# Patient Record
Sex: Male | Born: 1975 | Race: Black or African American | Hispanic: No | Smoking: Current every day smoker
Health system: Southern US, Community
[De-identification: ages and names within clinical notes are randomized; demographics above are authoritative.]

## PROBLEM LIST (undated history)

## (undated) DIAGNOSIS — Z87442 Personal history of urinary calculi: Secondary | ICD-10-CM

## (undated) DIAGNOSIS — K8020 Calculus of gallbladder without cholecystitis without obstruction: Secondary | ICD-10-CM

## (undated) DIAGNOSIS — K219 Gastro-esophageal reflux disease without esophagitis: Secondary | ICD-10-CM

## (undated) DIAGNOSIS — C801 Malignant (primary) neoplasm, unspecified: Secondary | ICD-10-CM

## (undated) HISTORY — PX: JOINT REPLACEMENT: SHX530

## (undated) HISTORY — PX: CHOLECYSTECTOMY: SHX55

## (undated) HISTORY — PX: APPENDECTOMY: SHX54

---
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
## 1997-07-13 HISTORY — PX: HAND SURGERY: SHX662

## 2005-04-06 ENCOUNTER — Emergency Department (HOSPITAL_COMMUNITY): Admission: EM | Admit: 2005-04-06 | Discharge: 2005-04-07 | Payer: Self-pay | Admitting: Emergency Medicine

## 2007-10-31 ENCOUNTER — Emergency Department (HOSPITAL_COMMUNITY): Admission: EM | Admit: 2007-10-31 | Discharge: 2007-10-31 | Payer: Self-pay | Admitting: Emergency Medicine

## 2007-12-08 ENCOUNTER — Emergency Department (HOSPITAL_COMMUNITY): Admission: EM | Admit: 2007-12-08 | Discharge: 2007-12-08 | Payer: Self-pay | Admitting: Emergency Medicine

## 2008-03-06 ENCOUNTER — Emergency Department (HOSPITAL_COMMUNITY): Admission: EM | Admit: 2008-03-06 | Discharge: 2008-03-06 | Payer: Self-pay | Admitting: Emergency Medicine

## 2008-03-13 ENCOUNTER — Encounter (INDEPENDENT_AMBULATORY_CARE_PROVIDER_SITE_OTHER): Payer: Self-pay | Admitting: General Surgery

## 2008-03-13 ENCOUNTER — Ambulatory Visit (HOSPITAL_COMMUNITY): Admission: RE | Admit: 2008-03-13 | Discharge: 2008-03-14 | Payer: Self-pay | Admitting: General Surgery

## 2009-06-01 ENCOUNTER — Emergency Department (HOSPITAL_COMMUNITY): Admission: EM | Admit: 2009-06-01 | Discharge: 2009-06-01 | Payer: Self-pay | Admitting: Emergency Medicine

## 2010-07-13 DIAGNOSIS — C801 Malignant (primary) neoplasm, unspecified: Secondary | ICD-10-CM

## 2010-07-13 HISTORY — DX: Malignant (primary) neoplasm, unspecified: C80.1

## 2010-10-15 LAB — URINE MICROSCOPIC-ADD ON

## 2010-10-15 LAB — URINALYSIS, ROUTINE W REFLEX MICROSCOPIC
Bilirubin Urine: NEGATIVE
Glucose, UA: NEGATIVE mg/dL
Ketones, ur: NEGATIVE mg/dL
Leukocytes, UA: NEGATIVE
Nitrite: NEGATIVE
Protein, ur: NEGATIVE mg/dL
Specific Gravity, Urine: 1.026 (ref 1.005–1.030)
Urobilinogen, UA: 0.2 mg/dL (ref 0.0–1.0)
pH: 5.5 (ref 5.0–8.0)

## 2010-10-15 LAB — BASIC METABOLIC PANEL
BUN: 14 mg/dL (ref 6–23)
CO2: 26 mEq/L (ref 19–32)
Calcium: 9.5 mg/dL (ref 8.4–10.5)
Creatinine, Ser: 1.08 mg/dL (ref 0.4–1.5)
GFR calc Af Amer: >60 mL/min (ref >60)
GFR calc non Af Amer: >60 mL/min (ref >60)
Glucose, Bld: 84 mg/dL (ref 70–99)
Potassium: 3.8 mEq/L (ref 3.5–5.1)
Sodium: 138 mEq/L (ref 135–145)

## 2010-10-15 LAB — DIFFERENTIAL
Basophils Absolute: 0.1 10*3/uL (ref 0.0–0.1)
Basophils Relative: 1 % (ref 0–1)
Eosinophils Absolute: 0.1 10*3/uL (ref 0.0–0.7)
Eosinophils Relative: 2 % (ref 0–5)
Lymphocytes Relative: 45 % (ref 12–46)
Lymphs Abs: 3.2 10*3/uL (ref 0.7–4.0)
Monocytes Absolute: 0.5 10*3/uL (ref 0.1–1.0)
Monocytes Relative: 7 % (ref 3–12)
Neutro Abs: 3.3 10*3/uL (ref 1.7–7.7)
Neutrophils Relative %: 46 % (ref 43–77)

## 2010-10-15 LAB — CBC
HCT: 41.4 % (ref 39.0–52.0)
Hemoglobin: 14 g/dL (ref 13.0–17.0)
MCHC: 33.8 g/dL (ref 30.0–36.0)
MCV: 85.8 fL (ref 78.0–100.0)
Platelets: 221 10*3/uL (ref 150–400)

## 2010-11-25 NOTE — Consult Note (Signed)
Kerry Wiley, Kerry Wiley NO.:  [id number]   MEDICAL RECORD NO.:  [id number]          PATIENT TYPE:  EMS   LOCATION:  ED                           FACILITY:  [facility]   PHYSICIAN:  Angelia Mould. Derrell Lolling, M.D.DATE OF BIRTH:  Feb 08, 1976   DATE OF CONSULTATION:  12/08/2007  DATE OF DISCHARGE:                                 CONSULTATION   EMERGENCY DEPARTMENT GENERAL SURGICAL CONSULTATION   REASON FOR CONSULTATION:  Evaluate symptomatic gallstones.   HISTORY OF PRESENT ILLNESS:  This is a 35 year old black man who states  that he has a 1-week history of intermittent episodes of epigastric  pain.  This will come and go.  He has been nauseated but has not  vomited.  The pain does not radiate to his back, it does not radiate to  his shoulder.  Denies fever or chills.  His bowel movements have been a  little softer this week.  No melena or blood in his stools.  He was  feeling well yesterday then last night after supper the pain got worse  and he came to the emergency room in the middle of night.  He had an  ultrasound this morning which shows gallstones but no evidence of any  inflammation, wall thickening or fluid.  Common bile duct measures 7.6  mm.  There is a complex left upper pole cystic lesion of his left  kidney.  This was noted in 2006 but is somewhat larger.  Dr. Preston Fleeting in  the emergency room has arranged for him to get a followup CT.  At this  time he is asymptomatic.  He has no nausea and all his pain has  resolved.  I was asked to evaluate him.   PAST HISTORY:  Cystic mass left kidney 2006 noted on CT scan.  He was  advised to see a urologist at that time but declined to do that.  He  denies any medical problems .  Specifically denies hypertension,  diabetes, asthma or cancer.  He had an appendectomy.  He had a right  total knee replacement.  He had right ankle surgery.  He had right hand  surgery.   CURRENT MEDICATIONS:  None.   DRUG ALLERGIES:  None known.   SOCIAL HISTORY:  The patient is engaged to be married.  He has one  child.  He works for [employer] in [employer].  He smokes one pack of  cigarettes per day.  Drinks alcohol on the weekends.   FAMILY HISTORY:  Father living, has hypertension and chronic renal  insufficiency.  Mother living and well.   REVIEW OF SYSTEMS:  A 15-system review of systems is performed and is  noncontributory except as described above.   PHYSICAL EXAMINATION:  GENERAL:  A pleasant young black man, somewhat  overweight in no distress.  His fiance and is father are with him  throughout the encounter.  VITAL SIGNS:  Temperature 97.5, blood pressure 156/93, pulse 64,  respirations 24, oxygen saturation 100% on room air.  HEENT:  EYES:  Sclerae clear.  Extraocular is intact.  No exophthalmos.  Ears,  nose, mouth and throat:  Nose, lips, tongue and oropharynx are  without gross lesions.  NECK:  Supple, nontender.  No adenopathy.  No mass.  No jugular  distention.  LUNGS:  Clear to auscultation.  No chest wall tenderness.  No chest wall  mass.  CARDIAC:  Heart is regular rate and rhythm, no murmur.  Radial, femoral  and posterior pulses are palpable.  No peripheral edema.  ABDOMEN:  Soft and nontender.  Liver and spleen not enlarged.  No mass.  No hernia.  EXTREMITIES:  Moves all four extremities well without pain or deformity.  He does have a vertically oriented incision on his right knee  anteriorly.  There is no peripheral edema.  NEUROLOGIC:  No gross motor or sensory deficits.   DIAGNOSTIC DATA:  Data are reviewed.  He has had a CBC, complete  metabolic panel, lipase and urinalysis which are all essentially normal.  He had an ultrasound as described above.   ASSESSMENT:  1. Chronic cholecystitis with cholelithiasis, now with recurring      attacks of biliary colic.  No evidence of acute inflammation at      this time.  2. Left renal cystic mass.  3. Status post right total knee replacement.  4. Tobacco  abuse.   PLAN:  1. The patient will be discharged home.  He has been given my business      card and has been asked to call my office to set up an appointment      to see me for preoperative consultation for cholecystectomy.  He      states that he wants to have the cholecystectomy.  2. I have advised him to see a urologist about his left renal mass,      and he states that he will do that.  3. I have discussed the indications and details of cholecystectomy      with him.  Risks and complications have been outlined, including      not limited to bleeding, infection, conversion to open laparotomy,      bile leak, injury to adjacent organs such as the intestine or bile      duct with major reconstructive surgery, wound problems such as      infection or hernia, cardiac and pulmonary problems, and other      unforeseen problems.  He seems to understand these issues well.  At      this time all his questions are answered.  He is in full agreement      with the plan.  He is willing to go home and follow up with me in      the office in a few days.      Angelia Mould. Derrell Lolling, M.D.  Electronically Signed     HMI/MEDQ  D:  12/08/2007  T:  12/08/2007  Job:  [id number]   cc:   Dione Booze, MD  [address] [place], [place] [address]

## 2010-11-25 NOTE — Op Note (Signed)
NAMETRAQUAN, DUARTE               ACCOUNT NO.:  [id number]   MEDICAL RECORD NO.:  [id number]          PATIENT TYPE:  OIB   LOCATION:  1518                         FACILITY:  [facility]   PHYSICIAN:  Angelia Mould. Derrell Lolling, M.D.DATE OF BIRTH:  [birth year]   DATE OF PROCEDURE:  03/13/2008  DATE OF DISCHARGE:                               OPERATIVE REPORT   PREOPERATIVE DIAGNOSIS:  Chronic cholecystitis with cholelithiasis.   POSTOPERATIVE DIAGNOSIS:  Chronic cholecystitis with cholelithiasis.   OPERATION PERFORMED:  Laparoscopic cholecystectomy with intraoperative  cholangiogram.   SURGEON:  Dr. Claud Kelp   ASSISTANT:  Dr. Consuello Bossier   OPERATIVE INDICATIONS:  This is a 35 year old black male who has had  intermittent episodes of epigastric and right upper quadrant pain and  nausea since May of this year.  This is typically brought on by meals.  He had an ultrasound which showed gallstones but was otherwise  unremarkable.  He had a CT scan which showed complex cyst of the left  upper pole of the kidney which has been present since 2006 and he is  being followed by Courtney Paris, M.D.  He has been counseled as  an outpatient regarding cholecystectomy.  He desires to have that done.  He is brought to operating room electively.   OPERATIVE FINDINGS:  The gallbladder was chronically inflamed.  There  was discolored and slightly thick-walled but there were only a few  adhesions to it.  The anatomy of the cystic duct, cystic artery and  common bile duct were conventional.  Intraoperative cholangiogram was  normal showing normal intrahepatic and extrahepatic bile ducts, no  filling defects, and no obstruction with good flow of contrast into the  duodenum.  The liver, stomach, duodenum, large intestine and small  intestine were normal to inspection.  There were some adhesions in the  right lower quadrant from previous appendectomy.   OPERATIVE TECHNIQUE:  Following induction of  general endotracheal  anesthesia the patient's abdomen was prepped and draped in sterile  fashion.  The patient was identified as to correct patient and correct  procedure.  Intravenous antibiotics were given.  Half percent Marcaine  with epinephrine was used as local infiltration anesthetic.  An 11 mm  Hassan trocar was inserted just above the umbilicus and secured the  pursestring suture of 0 Vicryl.  Pneumoperitoneum was created, video  camera was inserted with visualization and findings as described above.  A 10-mm trocars placed the subxiphoid region and two 5 mm trocars placed  the right upper quadrant.  The gallbladder was identified, the fundus  was elevated.  A few adhesions were taken down.  I dissected a large  lymph node of the triangle of Calot.  Divided the peritoneum and  isolated the cystic duct and cystic artery.  Cystic artery was isolated  as it went onto the wall of the gallbladder, was secured with multiple  metal clips and divided.  We then created a very large window behind the  cystic duct.  A cholangiogram catheter was inserted into the cystic  duct.  A cholangiogram was obtained using the C-arm  and the  cholangiogram was normal as described above.  The cholangiogram catheter  was removed, the cystic duct secured with multiple metal clips and  divided.  Gallbladder was dissected with electrocautery, placed in a  specimen bag and removed.  The operative field was inspected and  irrigated.  It was completely dry.  There was no bleeding or bile leak  whatsoever at the completion of the case.  All the irrigation fluid was  removed.  The trocars were removed under direct vision.  There was no  bleeding from trocar sites.  Pneumoperitoneum was released.  The fascia  at the umbilicus was closed with 0 Vicryl sutures.  Skin  incisions were closed with subcuticular sutures of 4-0 Monocryl and  Dermabond.  Clean bandages were placed and the patient taken recovery  room in  stable condition.  Estimated blood loss was about 10 mL.  Complications none.  Sponge, needle and instrument counts were correct.      Angelia Mould. Derrell Lolling, M.D.  Electronically Signed     HMI/MEDQ  D:  03/13/2008  T:  03/14/2008  Job:  [id number]

## 2011-04-08 LAB — CBC
MCV: 86.7
Platelets: 201
RBC: 4.47
WBC: 6.1

## 2011-04-08 LAB — URINALYSIS, ROUTINE W REFLEX MICROSCOPIC
Glucose, UA: NEGATIVE
Nitrite: NEGATIVE

## 2011-04-08 LAB — COMPREHENSIVE METABOLIC PANEL
Alkaline Phosphatase: 51
Calcium: 9.3
Creatinine, Ser: 1.11
GFR calc Af Amer: >60
GFR calc non Af Amer: >60
Glucose, Bld: 98
Total Bilirubin: 0.7
Total Protein: 6.1

## 2011-04-08 LAB — DIFFERENTIAL
Basophils Relative: 0
Lymphs Abs: 2.6
Monocytes Absolute: 0.4
Monocytes Relative: 6
Neutro Abs: 2.9
Neutrophils Relative %: 48

## 2011-04-08 LAB — LIPASE, BLOOD: Lipase: 25

## 2011-05-29 ENCOUNTER — Emergency Department (HOSPITAL_COMMUNITY): Payer: Self-pay

## 2011-05-29 ENCOUNTER — Encounter: Payer: Self-pay | Admitting: Emergency Medicine

## 2011-05-29 ENCOUNTER — Emergency Department (HOSPITAL_COMMUNITY)
Admission: EM | Admit: 2011-05-29 | Discharge: 2011-05-29 | Disposition: A | Discharge status: Home / Self Care | Payer: Self-pay | Attending: Emergency Medicine | Admitting: Emergency Medicine

## 2011-05-29 DIAGNOSIS — W208XXA Other cause of strike by thrown, projected or falling object, initial encounter: Secondary | ICD-10-CM | POA: Insufficient documentation

## 2011-05-29 DIAGNOSIS — R0789 Other chest pain: Secondary | ICD-10-CM | POA: Insufficient documentation

## 2011-05-29 DIAGNOSIS — Y990 Civilian activity done for income or pay: Secondary | ICD-10-CM | POA: Insufficient documentation

## 2011-05-29 DIAGNOSIS — R51 Headache: Secondary | ICD-10-CM | POA: Insufficient documentation

## 2011-05-29 DIAGNOSIS — Y9289 Other specified places as the place of occurrence of the external cause: Secondary | ICD-10-CM | POA: Insufficient documentation

## 2011-05-29 DIAGNOSIS — T07XXXA Unspecified multiple injuries, initial encounter: Secondary | ICD-10-CM | POA: Insufficient documentation

## 2011-05-29 DIAGNOSIS — M545 Low back pain, unspecified: Secondary | ICD-10-CM | POA: Insufficient documentation

## 2011-05-29 DIAGNOSIS — M542 Cervicalgia: Secondary | ICD-10-CM | POA: Insufficient documentation

## 2011-05-29 DIAGNOSIS — R109 Unspecified abdominal pain: Secondary | ICD-10-CM | POA: Insufficient documentation

## 2011-05-29 DIAGNOSIS — M25559 Pain in unspecified hip: Secondary | ICD-10-CM | POA: Insufficient documentation

## 2011-05-29 LAB — CBC
HCT: 41.5 % (ref 39.0–52.0)
MCH: 28.2 pg (ref 26.0–34.0)
RBC: 5.07 MIL/uL (ref 4.22–5.81)
RDW: 14.8 % (ref 11.5–15.5)
WBC: 8.4 10*3/uL (ref 4.0–10.5)

## 2011-05-29 LAB — DIFFERENTIAL
Eosinophils Absolute: 0.1 10*3/uL (ref 0.0–0.7)
Eosinophils Relative: 2 % (ref 0–5)
Lymphs Abs: 3.8 10*3/uL (ref 0.7–4.0)
Monocytes Absolute: 0.8 10*3/uL (ref 0.1–1.0)
Monocytes Relative: 9 % (ref 3–12)
Neutro Abs: 3.7 10*3/uL (ref 1.7–7.7)

## 2011-05-29 LAB — COMPREHENSIVE METABOLIC PANEL
AST: 22 U/L (ref 0–37)
Albumin: 4 g/dL (ref 3.5–5.2)
Alkaline Phosphatase: 61 U/L (ref 39–117)
BUN: 16 mg/dL (ref 6–23)
Calcium: 9.7 mg/dL (ref 8.4–10.5)

## 2011-05-29 LAB — URINALYSIS, ROUTINE W REFLEX MICROSCOPIC
Bilirubin Urine: NEGATIVE
Glucose, UA: NEGATIVE mg/dL
Leukocytes, UA: NEGATIVE
Protein, ur: NEGATIVE mg/dL
Urobilinogen, UA: 0.2 mg/dL (ref 0.0–1.0)

## 2011-05-29 MED ORDER — HYDROMORPHONE HCL PF 2 MG/ML IJ SOLN
2.0000 mg | Freq: Once | INTRAMUSCULAR | Status: AC
  Administered 2011-05-29: 2 mg via INTRAVENOUS
  Filled 2011-05-29: qty 1

## 2011-05-29 MED ORDER — CYCLOBENZAPRINE HCL 10 MG PO TABS: 10.0000 mg | ORAL_TABLET | Freq: Three times a day (TID) | ORAL | Status: AC | PRN

## 2011-05-29 MED ORDER — ONDANSETRON HCL 4 MG/2ML IJ SOLN
4.0000 mg | Freq: Once | INTRAMUSCULAR | Status: AC
  Administered 2011-05-29: 4 mg via INTRAMUSCULAR
  Filled 2011-05-29: qty 2

## 2011-05-29 MED ORDER — SODIUM CHLORIDE 0.9 % IV SOLN
INTRAVENOUS | Status: DC
  Administered 2011-05-29: 19:00:00 via INTRAVENOUS

## 2011-05-29 MED ORDER — ONDANSETRON HCL 4 MG/2ML IJ SOLN
4.0000 mg | Freq: Once | INTRAMUSCULAR | Status: AC
  Administered 2011-05-29: 4 mg via INTRAVENOUS
  Filled 2011-05-29: qty 2

## 2011-05-29 MED ORDER — IOHEXOL 300 MG/ML  SOLN
100.0000 mL | Freq: Once | INTRAMUSCULAR | Status: AC | PRN
  Administered 2011-05-29: 100 mL via INTRAVENOUS

## 2011-05-29 MED ORDER — OXYCODONE-ACETAMINOPHEN 5-325 MG PO TABS: 1.0000 | ORAL_TABLET | ORAL | Status: DC | PRN

## 2011-05-29 NOTE — ED Provider Notes (Signed)
History     CSN: [id number] Arrival date & time: 05/29/2011  3:18 PM   First MD Initiated Contact with Patient 05/29/11 1553      Chief Complaint  Patient presents with  . Work Related Injury    (Consider location/radiation/quality/duration/timing/severity/associated sxs/prior treatment) HPI Comments: Patient is a 35 year old man who says a wall 12' x 25 he fell on him at work yesterday. He did the right side of his body and he was smashed to the floor. He was not unconscious there is no bleeding he has pain in the right side of his head his right neck right chest the right side of his abdomen and lower back, in his right hip. He did not seek medical evaluation. He tried to take ibuprofen at home without relief. He did not go work today. He came to the hospital for evaluation because his pain was unremitting.   History reviewed. No pertinent past medical history.  Past Surgical History  Procedure Date  . Appendectomy   . Cholecystectomy   . Joint replacement     History reviewed. No pertinent family history.  History  Substance Use Topics  . Smoking status: Current Everyday Smoker  . Smokeless tobacco: Not on file  . Alcohol Use: Yes     social drinkier      Review of Systems  Constitutional: Negative.   HENT: Positive for neck pain.   Eyes: Negative.   Respiratory: Positive for chest tightness.   Cardiovascular:       He has right lateral chest wall pain.  Gastrointestinal: Negative.   Genitourinary: Negative.  Negative for hematuria.  Musculoskeletal:       He has pain in the right hip area.  Neurological: Negative.   Psychiatric/Behavioral: Negative.     Allergies  Review of patient's allergies indicates no known allergies.  Home Medications   Current Outpatient Rx  Name Route Sig Dispense Refill  . CYCLOBENZAPRINE HCL 10 MG PO TABS Oral Take 1 tablet (10 mg total) by mouth 3 (three) times daily as needed for muscle spasms. 15 tablet 0  .  OXYCODONE-ACETAMINOPHEN 5-325 MG PO TABS Oral Take 1 tablet by mouth every 4 (four) hours as needed for pain. 20 tablet 0    BP 146/83  Pulse 88  Temp(Src) 97.2 F (36.2 C) (Oral)  Resp 20  SpO2 95%  Physical Exam  Nursing note and vitals reviewed. Constitutional: He is oriented to person, place, and time. He appears well-developed and well-nourished. He appears distressed (patient is in moderate to severe distress with pain in the right side of his body.).       Patient is a young man in moderate to severe distress with pain in the right side of his body.  HENT:  Head: Normocephalic and atraumatic.  Right Ear: External ear normal.  Left Ear: External ear normal.  Mouth/Throat: Oropharynx is clear and moist.  Eyes: Conjunctivae and EOM are normal. Pupils are equal, round, and reactive to light.  Neck: Normal range of motion. Neck supple.       He localizes pain to the right posterior cervical paraspinous muscles. There is no palpable bony deformity of the neck  Cardiovascular: Normal rate and regular rhythm.   Pulmonary/Chest: Effort normal and breath sounds normal.       He localizes pain to the right lateral chest wall. There is no palpable deformity, crepitance, or subcutaneous emphysema.  Abdominal: Soft. Bowel sounds are normal.  Musculoskeletal:  He localizes pain to the lumbar region into the hip. There is some tenderness to palpation over the right greater trochanter. There is no palpable bony deformity of the right hip ORIF lumbar spine.  Neurological: He is alert and oriented to person, place, and time.       There no sensory or motor deficits.  Skin: Skin is warm and dry.  Psychiatric: He has a normal mood and affect. His behavior is normal.    ED Course  Procedures (including critical care time)  Labs Reviewed  URINALYSIS, ROUTINE W REFLEX MICROSCOPIC - Abnormal; Notable for the following:    Specific Gravity, Urine 1.031 (*)    Ketones, ur 40 (*)    All  other components within normal limits  CBC  DIFFERENTIAL  COMPREHENSIVE METABOLIC PANEL  ETHANOL   Pt was seen and had physical Exam.  Lab and x-ray workup were all negative. Rx for Percocet and Flexeril.  No work for 3 days.  1. Contusion of multiple sites           Carleene Cooper III, MD 05/29/11 [phone]

## 2011-05-29 NOTE — ED Notes (Signed)
Assumed care of patient, report from Christa RN. Awaiting disposition, MD now at bedside to speak with patient, will continue to monitor.

## 2011-05-29 NOTE — ED Notes (Signed)
Pt c/o pain since yesterday when at work and "a wall fell on me".  States his back, right arm/leg, and right abdomen are hurting.  Took ibuprofen PTA with no relief.  Right side of abdomen is firm and tender to palpation.

## 2011-05-29 NOTE — ED Notes (Signed)
Pt reports a 12 ft petition wall fell on him yesterday at work.  C/o pain all over.  States R side is hurting worse than left.

## 2011-05-29 NOTE — Discharge Instructions (Signed)
Contusion A bruise (contusion) or hematoma is a collection of blood under skin causing an area of discoloration. It is caused by an injury to blood vessels beneath the injured area with a release of blood into that area. As blood accumulates it is known as a hematoma. This collection of blood causes a blue to dark blue color. As the injury improves over days to weeks it turns to a yellowish color and then usually disappears completely over the same period of time. These generally resolve completely without problems. The hematoma rarely requires drainage. HOME CARE INSTRUCTIONS   Apply ice to the injured area for 15 to 20 minutes 3 to 4 times per day for the first 1 or 2 days.   Put the ice in a plastic bag and place a towel between the bag of ice and your skin. Discontinue the ice if it causes pain.   If bleeding is more than just a little, apply pressure to the area for at least thirty minutes to decrease the amount of bruising. Apply pressure and ice as your caregiver suggests.   If the injury is on an extremity, elevation of that part may help to decrease pain and swelling. Wrapping with an ace or supportive wrap may also be helpful. If the bruise is on a lower extremity and is painful, crutches may be helpful for a couple days.   If you have been given a tetanus shot because the skin was broken, your arm may get swollen, red and warm to touch at the shot site. This is a normal response to the medicine in the shot. If you did not receive a tetanus shot today because you did not recall when your last one was given, make sure to check with your caregiver's office and determine if one is needed. Generally for a "dirty" wound, you should receive a tetanus booster if you have not had one in the last five years. If you have a "clean" wound, you should receive a tetanus booster if you have not had one within the last ten years.  SEEK MEDICAL CARE IF:   You have pain not controlled with over the counter  medications. Only take over-the-counter or prescription medicines for pain, discomfort, or fever as directed by your caregiver. Do not use aspirin as it may cause bleeding.   You develop increasing pain or swelling in the area of injury.   You develop any problems which seem worse than the problems which brought you in.  SEEK IMMEDIATE MEDICAL CARE IF:   You have a fever.   You develop severe pain in the area of the bruise out of proportion to the initial injury.   The bruised area becomes red, tender, and swollen.  MAKE SURE YOU:   Understand these instructions.   Will watch your condition.   Will get help right away if you are not doing well or get worse.  Document Released: 04/08/2005 Document Revised: 03/11/2011 Document Reviewed: 02/15/2008 [facility] Patient Information 2012 [place], [place].  Kerry Wiley, you had physical exam, laboratory testing, and x-rays of your head, neck, chest, abdomen, and right hip, to check on you after a wall fell on you yesterday while at work. Fortunately your laboratory tests and x-rays showed no internal injury and no fracture. It is safe for you to go home. You can take the pain pills Percocet every 4 hours as needed for pain. You can take Flexeril, the muscle relaxant, 3 times a day. No work for 3 days.

## 2011-05-29 NOTE — ED Notes (Signed)
Patient being discharged. IV d/c catheter intact, dressing applied. D/C Instructions explained to patient, verbalizes understanding, no further questions. Patient provided with prescriptions for Percocet and Flexeril with instructions on dosage and administration. Work note provided as well. Patient escorted to discharge window with all personal belongings.

## 2012-01-10 ENCOUNTER — Emergency Department (HOSPITAL_BASED_OUTPATIENT_CLINIC_OR_DEPARTMENT_OTHER)
Admission: EM | Admit: 2012-01-10 | Discharge: 2012-01-10 | Disposition: A | Discharge status: Home Health | Payer: Self-pay | Attending: Emergency Medicine | Admitting: Emergency Medicine

## 2012-01-10 ENCOUNTER — Encounter (HOSPITAL_BASED_OUTPATIENT_CLINIC_OR_DEPARTMENT_OTHER): Payer: Self-pay | Admitting: *Deleted

## 2012-01-10 DIAGNOSIS — S60351A Superficial foreign body of right thumb, initial encounter: Secondary | ICD-10-CM

## 2012-01-10 DIAGNOSIS — F17200 Nicotine dependence, unspecified, uncomplicated: Secondary | ICD-10-CM | POA: Insufficient documentation

## 2012-01-10 DIAGNOSIS — X58XXXA Exposure to other specified factors, initial encounter: Secondary | ICD-10-CM | POA: Insufficient documentation

## 2012-01-10 DIAGNOSIS — H579 Unspecified disorder of eye and adnexa: Secondary | ICD-10-CM

## 2012-01-10 DIAGNOSIS — S60459A Superficial foreign body of unspecified finger, initial encounter: Secondary | ICD-10-CM | POA: Insufficient documentation

## 2012-01-10 MED ORDER — TETRACAINE HCL 0.5 % OP SOLN
OPHTHALMIC | Status: AC
  Administered 2012-01-10: 17:00:00
  Filled 2012-01-10: qty 2

## 2012-01-10 MED ORDER — LIDOCAINE HCL 2 % IJ SOLN: 20.0000 mL | Freq: Once | INTRAMUSCULAR | Status: DC

## 2012-01-10 MED ORDER — LIDOCAINE HCL 2 % IJ SOLN
INTRAMUSCULAR | Status: AC
  Filled 2012-01-10: qty 1

## 2012-01-10 MED ORDER — FLUORESCEIN SODIUM 1 MG OP STRP
ORAL_STRIP | OPHTHALMIC | Status: AC
  Administered 2012-01-10: 17:00:00
  Filled 2012-01-10: qty 1

## 2012-01-10 MED ORDER — TETANUS-DIPHTH-ACELL PERTUSSIS 5-2.5-18.5 LF-MCG/0.5 IM SUSP
INTRAMUSCULAR | Status: AC
  Administered 2012-01-10: 0.5 mL via INTRAMUSCULAR
  Filled 2012-01-10: qty 0.5

## 2012-01-10 MED ORDER — TETANUS-DIPHTH-ACELL PERTUSSIS 5-2.5-18.5 LF-MCG/0.5 IM SUSP
0.5000 mL | Freq: Once | INTRAMUSCULAR | Status: AC
  Administered 2012-01-10: 0.5 mL via INTRAMUSCULAR

## 2012-01-10 NOTE — Discharge Instructions (Signed)
Puncture Wound  A puncture wound is an injury that extends through all layers of the skin and into the tissue beneath the skin (subcutaneous tissue). Puncture wounds become infected easily because germs often enter the body and go beneath the skin during the injury. Having a deep wound with a small entrance point makes it difficult for your caregiver to adequately clean the wound. This is especially true if you have stepped on a nail and it has passed through a dirty shoe or other situations where the wound is obviously contaminated.  CAUSES   Many puncture wounds involve glass, nails, splinters, fish hooks, or other objects that enter the skin (foreign bodies). A puncture wound may also be caused by a human bite or animal bite.  DIAGNOSIS   A puncture wound is usually diagnosed by your history and a physical exam. You may need to have an X-Delano Frate or an ultrasound to check for any foreign bodies still in the wound.  TREATMENT    Your caregiver will clean the wound as thoroughly as possible. Depending on the location of the wound, a bandage (dressing) may be applied.   Your caregiver might prescribe antibiotic medicines.   You may need a follow-up visit to check on your wound. Follow all instructions as directed by your caregiver.  HOME CARE INSTRUCTIONS    Change your dressing once per day, or as directed by your caregiver. If the dressing sticks, it may be removed by soaking the area in water.   If your caregiver has given you follow-up instructions, it is very important that you return for a follow-up appointment. Not following up as directed could result in a chronic or permanent injury, pain, and disability.   Only take over-the-counter or prescription medicines for pain, discomfort, or fever as directed by your caregiver.   If you are given antibiotics, take them as directed. Finish them even if you start to feel better.  You may need a tetanus shot if:   You cannot remember when you had your last tetanus  shot.   You have never had a tetanus shot.  If you got a tetanus shot, your arm may swell, get red, and feel warm to the touch. This is common and not a problem. If you need a tetanus shot and you choose not to have one, there is a rare chance of getting tetanus. Sickness from tetanus can be serious.  You may need a rabies shot if an animal bite caused your puncture wound.  SEEK MEDICAL CARE IF:    You have redness, swelling, or increasing pain in the wound.   You have red streaks going away from the wound.   You notice a bad smell coming from the wound or dressing.   You have yellowish-white fluid (pus) coming from the wound.   You are treated with an antibiotic for infection, but the infection is not getting better.   You notice something in the wound, such as rubber from your shoe, cloth, or another object.   You have a fever.   You have severe pain.   You have difficulty breathing.   You feel dizzy or faint.   You cannot stop vomiting.   You lose feeling, develop numbness, or cannot move a limb below the wound.   Your symptoms worsen.  MAKE SURE YOU:   Understand these instructions.   Will watch your condition.   Will get help right away if you are not doing well or get worse.    [employer].

## 2012-01-10 NOTE — ED Notes (Signed)
Pt presents with fish hook in right thumb.

## 2012-01-10 NOTE — ED Provider Notes (Signed)
History  This chart was scribed for Kerry Quarry, MD by Bennett Scrape. This patient was seen in Room 2 and the patient's care was started at 4:52PM.  CSN: [id number]  Arrival date & time 01/10/12  1643   First MD Initiated Contact with Patient 01/10/12 1652      Chief Complaint  Patient presents with  . Foreign Body    Patient is a 36 y.o. male presenting with foreign body. The history is provided by the patient. No language interpreter was used.  Foreign Body  The current episode started less than 1 hour ago. Intake: in right thumb. Suspected object: fish hook. Pertinent negatives include no fever and no vomiting. There were no sick contacts. He has received no recent medical care.    Kerry Wiley is a 36 y.o. male who presents to the Emergency Department complaining of one fish hook in the right thumb that occurred approximately 20 minutes PTA. He states that he had live bait debris on the hoot at the time of the impalement. He reports that he tried to remove it on his own but was unable to due to pain. He denies taking OTC medications at home to improve symptoms.  He also c/o one month of an unknown foreign body in the right upper eyelid. He denies visual disturbance, fever, nausea and emesis as associated symptoms. He has a h/o renal disorder. TD vaccine is unknown. He is a current everyday smoker and occasional alcohol user.  No PCP.  Past Medical History  Diagnosis Date  . Renal disorder     Past Surgical History  Procedure Date  . Appendectomy   . Cholecystectomy   . Joint replacement   . Hand surgery   . Fracture surgery     History reviewed. No pertinent family history.  History  Substance Use Topics  . Smoking status: Current Everyday Smoker  . Smokeless tobacco: Not on file  . Alcohol Use: Yes     social drinkier      Review of Systems  Constitutional: Negative for fever.  Gastrointestinal: Negative for nausea and vomiting.  Skin: Positive for wound  (fish hook in right thumb).    Allergies  Review of patient's allergies indicates no known allergies.  Home Medications  No current outpatient prescriptions on file.  Triage Vitals: BP 156/86  Pulse 99  Temp 98.1 F (36.7 C) (Oral)  Resp 20  Ht 6\' 2"  (1.88 m)  Wt 245 lb (111.131 kg)  BMI 31.46 kg/m2  SpO2 96%  Physical Exam  Nursing note and vitals reviewed. Constitutional: He is oriented to person, place, and time. He appears well-developed and well-nourished. No distress.  HENT:  Head: Normocephalic and atraumatic.  Eyes: EOM are normal. Pupils are equal, round, and reactive to light.       Small area of irritated and swollen conjunctivae tissue where it meets the lid of the right eye  Neck: Neck supple. No tracheal deviation present.  Cardiovascular: Normal rate.   Pulmonary/Chest: Effort normal. No respiratory distress.  Musculoskeletal: Normal range of motion.       Fish hook in right thumb  Neurological: He is alert and oriented to person, place, and time.  Skin: Skin is warm and dry.  Psychiatric: He has a normal mood and affect. His behavior is normal.    ED Course  Procedures (including critical care time)  DIAGNOSTIC STUDIES: Oxygen Saturation is 96% on room air, adequate by my interpretation.    COORDINATION OF CARE:  4:55PM-Discussed foreign body removal with pt and pt agreed to plan.  FOREIGN BODY REMOVAL Performed by: Kerry Quarry, MD at 5:00PM Consent: Verbal consent obtained. Risks and benefits: risks, benefits and alternatives were discussed Patient identity confirmed: provided demographic data Time out performed prior to procedure Prepped and Draped in normal sterile fashion Wound explored  Foreign Body Location: Right thumb  Foreign Body Type: One Barbed Fish Hook  Anesthesia: local infiltration  Local anesthetic: lidocaine 1% without epinephrine  Anesthetic total: 1 ml  Foreign body was cut with pilars and then pushed through the  thumb  Irrigation method: irrigated with a copious amount of saline and vigorously wiped  Amount of cleaning: standard  Patient tolerance: Patient tolerated the procedure well with no immediate complications.  5:08PM-Pt's right eye was examined with the slit lamp. 5:13PM-Discussed discharge plan which includes a referral to an ophthalmologist  with pt and pt agreed to the plan.   Labs Reviewed - No data to display No results found.   No diagnosis found.    MDM  I personally performed the services described in this documentation, which was scribed in my presence. The recorded information has been reviewed and considered.        Kerry Quarry, MD 01/15/12 [phone]

## 2012-02-12 ENCOUNTER — Encounter (HOSPITAL_BASED_OUTPATIENT_CLINIC_OR_DEPARTMENT_OTHER): Payer: Self-pay

## 2012-02-12 ENCOUNTER — Emergency Department (HOSPITAL_BASED_OUTPATIENT_CLINIC_OR_DEPARTMENT_OTHER)
Admission: EM | Admit: 2012-02-12 | Discharge: 2012-02-12 | Disposition: A | Discharge status: Home / Self Care | Payer: Self-pay | Attending: Emergency Medicine | Admitting: Emergency Medicine

## 2012-02-12 ENCOUNTER — Emergency Department (HOSPITAL_BASED_OUTPATIENT_CLINIC_OR_DEPARTMENT_OTHER): Payer: Self-pay

## 2012-02-12 DIAGNOSIS — S62319A Displaced fracture of base of unspecified metacarpal bone, initial encounter for closed fracture: Secondary | ICD-10-CM | POA: Insufficient documentation

## 2012-02-12 DIAGNOSIS — S62309A Unspecified fracture of unspecified metacarpal bone, initial encounter for closed fracture: Secondary | ICD-10-CM

## 2012-02-12 DIAGNOSIS — W19XXXA Unspecified fall, initial encounter: Secondary | ICD-10-CM | POA: Insufficient documentation

## 2012-02-12 DIAGNOSIS — F17200 Nicotine dependence, unspecified, uncomplicated: Secondary | ICD-10-CM | POA: Insufficient documentation

## 2012-02-12 MED ORDER — HYDROCODONE-ACETAMINOPHEN 5-325 MG PO TABS
2.0000 | ORAL_TABLET | Freq: Once | ORAL | Status: AC
  Administered 2012-02-12: 2 via ORAL
  Filled 2012-02-12: qty 2

## 2012-02-12 MED ORDER — ONDANSETRON HCL 4 MG/2ML IJ SOLN
4.0000 mg | Freq: Once | INTRAMUSCULAR | Status: AC
  Administered 2012-02-12: 4 mg via INTRAMUSCULAR
  Filled 2012-02-12: qty 2

## 2012-02-12 MED ORDER — HYDROCODONE-ACETAMINOPHEN 5-325 MG PO TABS: 2.0000 | ORAL_TABLET | ORAL | Status: DC | PRN

## 2012-02-12 MED ORDER — HYDROMORPHONE HCL PF 1 MG/ML IJ SOLN
2.0000 mg | Freq: Once | INTRAMUSCULAR | Status: AC
  Administered 2012-02-12: 2 mg via INTRAMUSCULAR
  Filled 2012-02-12: qty 2

## 2012-02-12 NOTE — ED Provider Notes (Signed)
History     CSN: [id number]  Arrival date & time 02/12/12  1444   First MD Initiated Contact with Patient 02/12/12 1525      Chief Complaint  Patient presents with  . Hand Injury    (Consider location/radiation/quality/duration/timing/severity/associated sxs/prior treatment) Patient is a 36 y.o. male presenting with hand pain. The history is provided by the patient. No language interpreter was used.  Hand Pain This is a new problem. The current episode started today. The problem occurs constantly. The problem has been gradually worsening. Associated symptoms include joint swelling and myalgias. Nothing aggravates the symptoms. He has tried nothing for the symptoms. The treatment provided moderate relief.  Pt complains of pain in his right hand.  Pt reports he fell at work and hit right hand  Past Medical History  Diagnosis Date  . Renal disorder     Past Surgical History  Procedure Date  . Appendectomy   . Cholecystectomy   . Joint replacement   . Hand surgery   . Fracture surgery     No family history on file.  History  Substance Use Topics  . Smoking status: Current Everyday Smoker  . Smokeless tobacco: Not on file  . Alcohol Use: Yes     social drinkier      Review of Systems  Musculoskeletal: Positive for myalgias and joint swelling.  All other systems reviewed and are negative.    Allergies  Review of patient's allergies indicates no known allergies.  Home Medications  No current outpatient prescriptions on file.  BP 151/71  Pulse 86  Temp 98.2 F (36.8 C) (Oral)  Resp 16  Ht 6\' 2"  (1.88 m)  Wt 250 lb (113.399 kg)  BMI 32.10 kg/m2  SpO2 97%  Physical Exam  Nursing note and vitals reviewed. Constitutional: He appears well-developed and well-nourished.  HENT:  Head: Normocephalic and atraumatic.  Musculoskeletal: He exhibits tenderness.       Tender right hand,  From,  nv and ns intact,    Neurological: He is alert.  Skin: Skin is warm.    Psychiatric: He has a normal mood and affect.    ED Course  Procedures (including critical care time)  Labs Reviewed - No data to display Dg Forearm Right  02/12/2012  *RADIOLOGY REPORT*  Clinical Data: Fall with swelling.  RIGHT FOREARM - 2 VIEW  Comparison: Right hand 02/12/2012  Findings: Two views of the forearm demonstrate a mildly displaced fracture involving the proximal fifth metacarpal bone.  Fracture appears to extend to the articulating surface.  Plate and screw fixation of the fourth metacarpal bone.  The radius and ulna are intact.  IMPRESSION: Fracture involving the proximal fifth metacarpal bone.  Original Report Authenticated By: Richarda Overlie, M.D.   Dg Wrist Complete Right  02/12/2012  *RADIOLOGY REPORT*  Clinical Data: Larey Seat on right hand.  Pain at fifth metacarpal bone.  RIGHT WRIST - COMPLETE 3+ VIEW  Comparison: None.  Findings: Patient has plate and screw fixation of the fourth metacarpal bone. There is a mildly displaced fracture involving the proximal fifth metacarpal bone.  There is marked soft tissue swelling adjacent to the fracture. Fracture appears to involve the carpal metacarpal joint.  There is deformity of the distal fifth metacarpal bone and suspect an old fracture at this site.  IMPRESSION: Mildly displaced fracture involving the proximal fifth metacarpal bone.  Original Report Authenticated By: Richarda Overlie, M.D.   Dg Hand Complete Right  02/12/2012  *RADIOLOGY REPORT*  Clinical Data:  Pain at fifth metacarpal bone.  RIGHT HAND - COMPLETE 3+ VIEW  Comparison: Right wrist 02/12/2012  Findings: Fracture involving the proximal fifth metacarpal bone. Fracture probably extends to the articulating surface of the carpal metacarpal joint.  Plate and screw fixation of the fourth metacarpal bone.  Deformity of the distal fifth metacarpal bone suggests an old injury. Soft tissue swelling adjacent to the fifth metacarpal bone.  IMPRESSION: Displaced fracture involving the proximal fifth  metacarpal bone.  Original Report Authenticated By: Richarda Overlie, M.D.     No diagnosis found.    MDM  Splint,  Ice,   Hydrocodone         Lonia Skinner [place], [place] 02/12/12 [phone]

## 2012-02-12 NOTE — ED Provider Notes (Signed)
Medical screening examination/treatment/procedure(s) were performed by non-physician practitioner and as supervising physician I was immediately available for consultation/collaboration.   Gavin Pound. Jagger Demonte, MD 02/12/12 1607

## 2012-02-12 NOTE — ED Notes (Signed)
Injury to right hand after falling at work.

## 2012-02-12 NOTE — ED Notes (Signed)
Patient transported to X-ray 

## 2012-02-16 ENCOUNTER — Other Ambulatory Visit: Payer: Self-pay | Admitting: Orthopedic Surgery

## 2012-02-16 ENCOUNTER — Encounter (HOSPITAL_COMMUNITY)
Admission: RE | Admit: 2012-02-16 | Discharge: 2012-02-16 | Disposition: A | Discharge status: Home / Self Care | Payer: Self-pay | Source: Ambulatory Visit | Attending: Orthopedic Surgery | Admitting: Orthopedic Surgery

## 2012-02-16 ENCOUNTER — Encounter (HOSPITAL_COMMUNITY): Payer: Self-pay | Admitting: Pharmacy Technician

## 2012-02-16 ENCOUNTER — Encounter (HOSPITAL_COMMUNITY): Payer: Self-pay

## 2012-02-16 HISTORY — DX: Gastro-esophageal reflux disease without esophagitis: K21.9

## 2012-02-16 LAB — CBC
HCT: 42.3 % (ref 39.0–52.0)
Hemoglobin: 14.4 g/dL (ref 13.0–17.0)
MCH: 28.3 pg (ref 26.0–34.0)
MCHC: 34 g/dL (ref 30.0–36.0)
MCV: 83.3 fL (ref 78.0–100.0)
Platelets: 263 10*3/uL (ref 150–400)
RBC: 5.08 MIL/uL (ref 4.22–5.81)
RDW: 15.1 % (ref 11.5–15.5)
WBC: 8.7 10*3/uL (ref 4.0–10.5)

## 2012-02-16 LAB — SURGICAL PCR SCREEN
MRSA, PCR: NEGATIVE
Staphylococcus aureus: POSITIVE — AB

## 2012-02-16 MED ORDER — CEFAZOLIN SODIUM-DEXTROSE 2-3 GM-% IV SOLR
2.0000 g | INTRAVENOUS | Status: AC
  Administered 2012-02-17: 2 g via INTRAVENOUS

## 2012-02-16 MED ORDER — CHLORHEXIDINE GLUCONATE 4 % EX LIQD: 60.0000 mL | Freq: Once | CUTANEOUS | Status: DC

## 2012-02-16 NOTE — Progress Notes (Signed)
Pt notified of time change;to arrive at 12:45 pm;nothing to eat/drink after midnight

## 2012-02-16 NOTE — Pre-Procedure Instructions (Addendum)
20 Anan Stutsman  02/16/2012   Your procedure is scheduled on:  02/17/12  Report to [facility] at 2:15 PM.  Call this number if you have problems the morning of surgery: [phone]   Remember: Bring Incentive Spirometer back in suitcase day of surgery.   Do not eat food or any liquids :After Midnight.   Take these medicines the morning of surgery with A SIP OF WATER: NONE   Do not wear jewelry, make-up or nail polish.  Do not wear lotions, powders, or perfumes. You may wear deodorant.  Do not shave 48 hours prior to surgery. Men may shave face and neck.  Do not bring valuables to the hospital.  Contacts, dentures or bridgework may not be worn into surgery.  Leave suitcase in the car. After surgery it may be brought to your room.  For patients admitted to the hospital, checkout time is 11:00 AM the day of discharge.   Patients discharged the day of surgery will not be allowed to drive home.  Name and phone number of your driver: girlfriend  Special Instructions: CHG Shower Use Special Wash: 1/2 bottle night before surgery and 1/2 bottle morning of surgery.   Please read over the following fact sheets that you were given: Pain Booklet, Coughing and Deep Breathing, MRSA Information and Surgical Site Infection Prevention

## 2012-02-17 ENCOUNTER — Ambulatory Visit (HOSPITAL_COMMUNITY)
Admission: RE | Admit: 2012-02-17 | Discharge: 2012-02-17 | Disposition: A | Discharge status: Home / Self Care | Payer: Self-pay | Source: Ambulatory Visit | Attending: Orthopedic Surgery | Admitting: Orthopedic Surgery

## 2012-02-17 ENCOUNTER — Encounter (HOSPITAL_COMMUNITY): Payer: Self-pay | Admitting: Anesthesiology

## 2012-02-17 ENCOUNTER — Ambulatory Visit (HOSPITAL_COMMUNITY): Payer: Self-pay | Admitting: Anesthesiology

## 2012-02-17 ENCOUNTER — Encounter (HOSPITAL_COMMUNITY): Payer: Self-pay | Admitting: *Deleted

## 2012-02-17 ENCOUNTER — Encounter (HOSPITAL_COMMUNITY)
Admission: RE | Disposition: A | Discharge status: Home / Self Care | Payer: Self-pay | Source: Ambulatory Visit | Attending: Orthopedic Surgery

## 2012-02-17 DIAGNOSIS — Z01812 Encounter for preprocedural laboratory examination: Secondary | ICD-10-CM | POA: Insufficient documentation

## 2012-02-17 DIAGNOSIS — S62319A Displaced fracture of base of unspecified metacarpal bone, initial encounter for closed fracture: Secondary | ICD-10-CM | POA: Insufficient documentation

## 2012-02-17 DIAGNOSIS — S62309A Unspecified fracture of unspecified metacarpal bone, initial encounter for closed fracture: Secondary | ICD-10-CM | POA: Diagnosis present

## 2012-02-17 DIAGNOSIS — F17200 Nicotine dependence, unspecified, uncomplicated: Secondary | ICD-10-CM | POA: Insufficient documentation

## 2012-02-17 DIAGNOSIS — X58XXXA Exposure to other specified factors, initial encounter: Secondary | ICD-10-CM | POA: Insufficient documentation

## 2012-02-17 DIAGNOSIS — K219 Gastro-esophageal reflux disease without esophagitis: Secondary | ICD-10-CM | POA: Insufficient documentation

## 2012-02-17 HISTORY — DX: Personal history of urinary calculi: Z87.442

## 2012-02-17 SURGERY — CLOSED REDUCTION, FRACTURE, METACARPAL BONE, WITH PERCUTANEOUS PINNING
Anesthesia: General | Site: Hand | Laterality: Right | Wound class: Clean

## 2012-02-17 MED ORDER — PROMETHAZINE HCL 25 MG/ML IJ SOLN
6.2500 mg | INTRAMUSCULAR | Status: DC | PRN
  Administered 2012-02-17: 6.25 mg via INTRAVENOUS

## 2012-02-17 MED ORDER — CEFAZOLIN SODIUM-DEXTROSE 2-3 GM-% IV SOLR
INTRAVENOUS | Status: AC
  Filled 2012-02-17: qty 50

## 2012-02-17 MED ORDER — OXYCODONE-ACETAMINOPHEN 5-325 MG PO TABS
2.0000 | ORAL_TABLET | Freq: Once | ORAL | Status: AC
  Administered 2012-02-17: 2 via ORAL

## 2012-02-17 MED ORDER — HYDROMORPHONE HCL PF 1 MG/ML IJ SOLN
INTRAMUSCULAR | Status: AC
  Administered 2012-02-17: 0.5 mg via INTRAVENOUS
  Filled 2012-02-17: qty 1

## 2012-02-17 MED ORDER — MIDAZOLAM HCL 5 MG/5ML IJ SOLN
INTRAMUSCULAR | Status: DC | PRN
  Administered 2012-02-17: 2 mg via INTRAVENOUS

## 2012-02-17 MED ORDER — 0.9 % SODIUM CHLORIDE (POUR BTL) OPTIME
TOPICAL | Status: DC | PRN
  Administered 2012-02-17: 1000 mL

## 2012-02-17 MED ORDER — LACTATED RINGERS IV SOLN
INTRAVENOUS | Status: DC | PRN
  Administered 2012-02-17: 14:00:00 via INTRAVENOUS

## 2012-02-17 MED ORDER — MIDAZOLAM HCL 2 MG/2ML IJ SOLN: 0.5000 mg | Freq: Once | INTRAMUSCULAR | Status: DC | PRN

## 2012-02-17 MED ORDER — BUPIVACAINE HCL (PF) 0.25 % IJ SOLN
INTRAMUSCULAR | Status: AC
  Filled 2012-02-17: qty 30

## 2012-02-17 MED ORDER — OXYCODONE-ACETAMINOPHEN 5-325 MG PO TABS: 1.0000 | ORAL_TABLET | ORAL | Status: DC | PRN

## 2012-02-17 MED ORDER — MUPIROCIN 2 % EX OINT
TOPICAL_OINTMENT | CUTANEOUS | Status: AC
  Administered 2012-02-17: 1 via NASAL
  Filled 2012-02-17: qty 22

## 2012-02-17 MED ORDER — OXYCODONE-ACETAMINOPHEN 5-325 MG PO TABS
ORAL_TABLET | ORAL | Status: AC
  Administered 2012-02-17: 2 via ORAL
  Filled 2012-02-17: qty 2

## 2012-02-17 MED ORDER — ONDANSETRON HCL 4 MG/2ML IJ SOLN
INTRAMUSCULAR | Status: DC | PRN
  Administered 2012-02-17: 4 mg via INTRAVENOUS

## 2012-02-17 MED ORDER — LACTATED RINGERS IV SOLN
INTRAVENOUS | Status: DC
  Administered 2012-02-17: 14:00:00 via INTRAVENOUS

## 2012-02-17 MED ORDER — LIDOCAINE HCL (CARDIAC) 20 MG/ML IV SOLN
INTRAVENOUS | Status: DC | PRN
  Administered 2012-02-17: 50 mg via INTRAVENOUS

## 2012-02-17 MED ORDER — MUPIROCIN 2 % EX OINT
TOPICAL_OINTMENT | Freq: Two times a day (BID) | CUTANEOUS | Status: DC
  Administered 2012-02-17: 1 via NASAL
  Filled 2012-02-17: qty 22

## 2012-02-17 MED ORDER — BUPIVACAINE HCL (PF) 0.25 % IJ SOLN
INTRAMUSCULAR | Status: DC | PRN
  Administered 2012-02-17: 5 mL

## 2012-02-17 MED ORDER — MEPERIDINE HCL 25 MG/ML IJ SOLN: 6.2500 mg | INTRAMUSCULAR | Status: DC | PRN

## 2012-02-17 MED ORDER — DEXAMETHASONE SODIUM PHOSPHATE 4 MG/ML IJ SOLN
INTRAMUSCULAR | Status: DC | PRN
  Administered 2012-02-17: 8 mg via INTRAVENOUS

## 2012-02-17 MED ORDER — HYDROMORPHONE HCL PF 1 MG/ML IJ SOLN
0.2500 mg | INTRAMUSCULAR | Status: DC | PRN
  Administered 2012-02-17 (×4): 0.5 mg via INTRAVENOUS

## 2012-02-17 MED ORDER — PROMETHAZINE HCL 25 MG/ML IJ SOLN
INTRAMUSCULAR | Status: AC
  Filled 2012-02-17: qty 1

## 2012-02-17 MED ORDER — FENTANYL CITRATE 0.05 MG/ML IJ SOLN
INTRAMUSCULAR | Status: DC | PRN
  Administered 2012-02-17: 50 ug via INTRAVENOUS
  Administered 2012-02-17: 25 ug via INTRAVENOUS
  Administered 2012-02-17: 75 ug via INTRAVENOUS
  Administered 2012-02-17 (×2): 50 ug via INTRAVENOUS

## 2012-02-17 MED ORDER — PROPOFOL 10 MG/ML IV BOLUS
INTRAVENOUS | Status: DC | PRN
  Administered 2012-02-17: 200 mg via INTRAVENOUS

## 2012-02-17 SURGICAL SUPPLY — 15 items
BANDAGE CONFORM 2  STR LF (GAUZE/BANDAGES/DRESSINGS) ×1 IMPLANT
BANDAGE CONFORM 3  STR LF (GAUZE/BANDAGES/DRESSINGS) ×1 IMPLANT
BANDAGE ELASTIC 4 VELCRO ST LF (GAUZE/BANDAGES/DRESSINGS) ×1 IMPLANT
CUFF TOURNIQUET SINGLE 18IN (TOURNIQUET CUFF) ×1 IMPLANT
GAUZE XEROFORM 1X8 LF (GAUZE/BANDAGES/DRESSINGS) ×1 IMPLANT
GLOVE BIO SURGEON STRL SZ8.5 (GLOVE) ×3 IMPLANT
GOWN PREVENTION PLUS XLARGE (GOWN DISPOSABLE) ×2 IMPLANT
K-WIRE .045X6 DBL TRO NS (WIRE) ×4
KIT BASIN OR (CUSTOM PROCEDURE TRAY) ×1 IMPLANT
KWIRE .045X6 DBL TRO NS (WIRE) IMPLANT
PACK ORTHO EXTREMITY (CUSTOM PROCEDURE TRAY) ×1 IMPLANT
PADDING CAST ABS 3INX4YD NS (CAST SUPPLIES) ×1
PADDING CAST ABS COTTON 3X4 (CAST SUPPLIES) IMPLANT
SPLINT FIBERGLASS 4X15 (CAST SUPPLIES) ×1 IMPLANT
SPONGE GAUZE 4X4 12PLY (GAUZE/BANDAGES/DRESSINGS) ×1 IMPLANT

## 2012-02-17 NOTE — Anesthesia Postprocedure Evaluation (Signed)
  Anesthesia Post-op Note  Patient: Kerry Wiley  Procedure(s) Performed: Procedure(s) (LRB): CLOSED REDUCTION METACARPAL WITH PERCUTANEOUS PINNING (Right)  Patient Location: PACU  Anesthesia Type: General  Level of Consciousness: awake, alert  and oriented  Airway and Oxygen Therapy: Patient Spontanous Breathing  Post-op Pain: mild  Post-op Assessment: Post-op Vital signs reviewed, Patient's Cardiovascular Status Stable, Respiratory Function Stable, Patent Airway, No signs of Nausea or vomiting and Pain level controlled  Post-op Vital Signs: Reviewed and stable  Complications: No apparent anesthesia complications

## 2012-02-17 NOTE — H&P (Signed)
Kerry Wiley is an 36 y.o. male.   Chief Complaint: r hand pain HPI: as above with right small metacarpal base fracture  Past Medical History  Diagnosis Date  . GERD (gastroesophageal reflux disease)     states he has some but not "diagnosed"  . History of kidney stones     Past Surgical History  Procedure Date  . Appendectomy   . Cholecystectomy   . Joint replacement   . Hand surgery   . Fracture surgery     History reviewed. No pertinent family history. Social History:  reports that he has been smoking Cigarettes.  He has been smoking about .5 packs per day. He has never used smokeless tobacco. He reports that he drinks alcohol. He reports that he does not use illicit drugs.  Allergies: No Known Allergies  No prescriptions prior to admission    Results for orders placed during the hospital encounter of 02/16/12 (from the past 48 hour(s))  SURGICAL PCR SCREEN     Status: Abnormal   Collection Time   02/16/12  1:25 PM      Component Value Range Comment   MRSA, PCR NEGATIVE  NEGATIVE    Staphylococcus aureus POSITIVE (*) NEGATIVE   CBC     Status: Normal   Collection Time   02/16/12  1:33 PM      Component Value Range Comment   WBC 8.7  4.0 - 10.5 K/uL    RBC 5.08  4.22 - 5.81 MIL/uL    Hemoglobin 14.4  13.0 - 17.0 g/dL    HCT 16.1  09.6 - 04.5 %    MCV 83.3  78.0 - 100.0 fL    MCH 28.3  26.0 - 34.0 pg    MCHC 34.0  30.0 - 36.0 g/dL    RDW 40.9  81.1 - 91.4 %    Platelets 263  150 - 400 K/uL    No results found.  Review of Systems  All other systems reviewed and are negative.    Blood pressure 128/83, pulse 67, temperature 98.1 F (36.7 C), temperature source Oral, resp. rate 18, SpO2 95.00%. Physical Exam  Constitutional: He is oriented to person, place, and time. He appears well-developed and well-nourished.  HENT:  Head: Normocephalic and atraumatic.  Cardiovascular: Normal rate.   Respiratory: Effort normal.  Musculoskeletal:       Right hand: He  exhibits tenderness, bony tenderness and swelling.  Neurological: He is alert and oriented to person, place, and time.  Skin: Skin is warm.  Psychiatric: He has a normal mood and affect. His behavior is normal. Judgment and thought content normal.     Assessment/Plan As above   Plan closed reduction and pinning  Donnica Jarnagin A 02/17/2012, 2:53 PM

## 2012-02-17 NOTE — Transfer of Care (Signed)
Immediate Anesthesia Transfer of Care Note  Patient: Kerry Wiley  Procedure(s) Performed: Procedure(s) (LRB): CLOSED REDUCTION METACARPAL WITH PERCUTANEOUS PINNING (Right)  Patient Location: PACU  Anesthesia Type: General  Level of Consciousness: awake, alert  and oriented  Airway & Oxygen Therapy: Patient Spontanous Breathing  Post-op Assessment: Report given to PACU RN and Post -op Vital signs reviewed and stable  Post vital signs: Reviewed and stable  Complications: No apparent anesthesia complications

## 2012-02-17 NOTE — Op Note (Signed)
See note [phone]

## 2012-02-17 NOTE — Anesthesia Preprocedure Evaluation (Addendum)
Anesthesia Evaluation  Patient identified by MRN, date of birth, ID band Patient awake    Reviewed: Allergy & Precautions, H&P , NPO status , Patient's Chart, lab work & pertinent test results  History of Anesthesia Complications Negative for: history of anesthetic complications  Airway Mallampati: II TM Distance: >3 FB Neck ROM: Full    Dental No notable dental hx. (+) Teeth Intact and Dental Advisory Given   Pulmonary Current Smoker,  breath sounds clear to auscultation  Pulmonary exam normal       Cardiovascular negative cardio ROS  Rhythm:Regular Rate:Normal     Neuro/Psych negative neurological ROS  negative psych ROS   GI/Hepatic Neg liver ROS, GERD-  Controlled,  Endo/Other  negative endocrine ROS  Renal/GU negative Renal ROS     Musculoskeletal negative musculoskeletal ROS (+)   Abdominal (+) + obese,   Peds  Hematology negative hematology ROS (+)   Anesthesia Other Findings   Reproductive/Obstetrics                          Anesthesia Physical Anesthesia Plan  ASA: II  Anesthesia Plan: General   Post-op Pain Management:    Induction: Intravenous  Airway Management Planned: LMA  Additional Equipment:   Intra-op Plan:   Post-operative Plan:   Informed Consent: I have reviewed the patients History and Physical, chart, labs and discussed the procedure including the risks, benefits and alternatives for the proposed anesthesia with the patient or authorized representative who has indicated his/her understanding and acceptance.   Dental advisory given  Plan Discussed with: CRNA and Surgeon  Anesthesia Plan Comments: (Plan routine monitors, GA- LMA OK)        Anesthesia Quick Evaluation

## 2012-02-17 NOTE — Brief Op Note (Signed)
02/17/2012  3:35 PM  PATIENT:  Kerry Wiley  36 y.o. male  PRE-OPERATIVE DIAGNOSIS:  RIGHT SMALL METACARPAL FRACTURE  POST-OPERATIVE DIAGNOSIS:  * No post-op diagnosis entered *  PROCEDURE:  Procedure(s) (LRB): CLOSED REDUCTION METACARPAL WITH PERCUTANEOUS PINNING (Right)  SURGEON:  Surgeon(s) and Role:    * Marlowe Shores, MD - Primary  PHYSICIAN ASSISTANT:   ASSISTANTS: none   ANESTHESIA:   general  EBL:     BLOOD ADMINISTERED:none  DRAINS: none   LOCAL MEDICATIONS USED:  MARCAINE   5cc  SPECIMEN:  No Specimen  DISPOSITION OF SPECIMEN:  N/A  COUNTS:  YES  TOURNIQUET:   Total Tourniquet Time Documented: Upper Arm (Right) - 13 minutes  DICTATION: .Other Dictation: Dictation Number [id number]  PLAN OF CARE: Discharge to home after PACU  PATIENT DISPOSITION:  PACU - hemodynamically stable.   Delay start of Pharmacological VTE agent (>24hrs) due to surgical blood loss or risk of bleeding: not applicable

## 2012-02-17 NOTE — Preoperative (Signed)
Beta Blockers   Reason not to administer Beta Blockers:Not Applicable 

## 2012-02-18 NOTE — Op Note (Signed)
NAMEMANLY, NESTLE               ACCOUNT NO.:  [id number]  MEDICAL RECORD NO.:  [id number]  LOCATION:  [facility]                         FACILITY:  [facility]  PHYSICIAN:  Artist Pais. Babetta Paterson, M.D.DATE OF BIRTH:  [birth year]  DATE OF PROCEDURE:  02/17/2012 DATE OF DISCHARGE:  02/17/2012                              OPERATIVE REPORT   PREOPERATIVE DIAGNOSIS:  Displaced fifth metacarpal base fracture, right hand.  POSTOPERATIVE DIAGNOSIS:  Displaced fifth metacarpal base fracture, right hand.  PROCEDURE:  Closed reduction and percutaneous pinning of above.  SURGEON:  Artist Pais. Mina Marble, MD  ASSISTANT:  None.  ANESTHESIA:  General.  No complications.  No drains.  DESCRIPTION OF PROCEDURE:  The patient was taken to the operating suite. After induction of adequate general anesthesia, right upper extremity was prepped and draped in usual sterile fashion.  An Esmarch was used to exsanguinate the limb.  Tourniquet was inflated to 250 mmHg.  At this point in time, longitudinal traction was placed on the fifth metacarpal and fifth digit.  Intraoperative fluoroscopy revealed reduction of the oblique fracture at the base of the fifth metacarpal.  This was fixed proximal and distal to the fracture site with a 0.045 K-wires x2, directed into the shaft of the fourth metacarpal to maintain length and reduction of the joint.  These were cut outside the skin, bent upon themselves.  Dressed with Xeroform, 4x4s, and the sterile dressing including ulnar gutter splint.  The patient tolerated the procedure well and went to the recovery room in stable fashion.     Artist Pais Mina Marble, M.D.     MAW/MEDQ  D:  02/17/2012  T:  02/18/2012  Job:  [id number]

## 2012-02-22 ENCOUNTER — Ambulatory Visit: Payer: Self-pay | Attending: Orthopedic Surgery | Admitting: *Deleted

## 2012-02-22 DIAGNOSIS — M25649 Stiffness of unspecified hand, not elsewhere classified: Secondary | ICD-10-CM | POA: Insufficient documentation

## 2012-02-22 DIAGNOSIS — M25549 Pain in joints of unspecified hand: Secondary | ICD-10-CM | POA: Insufficient documentation

## 2012-02-22 DIAGNOSIS — IMO0001 Reserved for inherently not codable concepts without codable children: Secondary | ICD-10-CM | POA: Insufficient documentation

## 2012-03-07 ENCOUNTER — Ambulatory Visit: Payer: Self-pay | Admitting: Occupational Therapy

## 2012-04-01 ENCOUNTER — Encounter (HOSPITAL_BASED_OUTPATIENT_CLINIC_OR_DEPARTMENT_OTHER): Payer: Self-pay | Admitting: *Deleted

## 2012-04-01 NOTE — Progress Notes (Signed)
Bring all medications

## 2012-04-04 ENCOUNTER — Encounter (HOSPITAL_BASED_OUTPATIENT_CLINIC_OR_DEPARTMENT_OTHER): Payer: Self-pay | Admitting: Certified Registered Nurse Anesthetist

## 2012-04-04 ENCOUNTER — Encounter (HOSPITAL_BASED_OUTPATIENT_CLINIC_OR_DEPARTMENT_OTHER): Payer: Self-pay | Admitting: Anesthesiology

## 2012-04-04 ENCOUNTER — Encounter (HOSPITAL_BASED_OUTPATIENT_CLINIC_OR_DEPARTMENT_OTHER)
Admission: RE | Disposition: A | Discharge status: Home / Self Care | Payer: Self-pay | Source: Ambulatory Visit | Attending: Orthopedic Surgery

## 2012-04-04 ENCOUNTER — Ambulatory Visit (HOSPITAL_BASED_OUTPATIENT_CLINIC_OR_DEPARTMENT_OTHER): Payer: Self-pay | Admitting: Certified Registered Nurse Anesthetist

## 2012-04-04 ENCOUNTER — Ambulatory Visit (HOSPITAL_BASED_OUTPATIENT_CLINIC_OR_DEPARTMENT_OTHER)
Admission: RE | Admit: 2012-04-04 | Discharge: 2012-04-04 | Disposition: A | Discharge status: Home / Self Care | Payer: Self-pay | Source: Ambulatory Visit | Attending: Orthopedic Surgery | Admitting: Orthopedic Surgery

## 2012-04-04 ENCOUNTER — Encounter (HOSPITAL_BASED_OUTPATIENT_CLINIC_OR_DEPARTMENT_OTHER): Payer: Self-pay | Admitting: *Deleted

## 2012-04-04 DIAGNOSIS — S62309B Unspecified fracture of unspecified metacarpal bone, initial encounter for open fracture: Secondary | ICD-10-CM | POA: Diagnosis present

## 2012-04-04 DIAGNOSIS — M79609 Pain in unspecified limb: Secondary | ICD-10-CM | POA: Insufficient documentation

## 2012-04-04 DIAGNOSIS — Y849 Medical procedure, unspecified as the cause of abnormal reaction of the patient, or of later complication, without mention of misadventure at the time of the procedure: Secondary | ICD-10-CM | POA: Insufficient documentation

## 2012-04-04 DIAGNOSIS — T8489XA Other specified complication of internal orthopedic prosthetic devices, implants and grafts, initial encounter: Secondary | ICD-10-CM | POA: Insufficient documentation

## 2012-04-04 DIAGNOSIS — K219 Gastro-esophageal reflux disease without esophagitis: Secondary | ICD-10-CM | POA: Insufficient documentation

## 2012-04-04 HISTORY — PX: HARDWARE REMOVAL: SHX979

## 2012-04-04 LAB — POCT HEMOGLOBIN-HEMACUE: Hemoglobin: 13.9 g/dL (ref 13.0–17.0)

## 2012-04-04 SURGERY — REMOVAL, HARDWARE
Anesthesia: General | Site: Hand | Laterality: Right | Wound class: Clean

## 2012-04-04 MED ORDER — DEXAMETHASONE SODIUM PHOSPHATE 4 MG/ML IJ SOLN
INTRAMUSCULAR | Status: DC | PRN
  Administered 2012-04-04: 10 mg via INTRAVENOUS

## 2012-04-04 MED ORDER — LIDOCAINE HCL (CARDIAC) 20 MG/ML IV SOLN
INTRAVENOUS | Status: DC | PRN
  Administered 2012-04-04: 100 mg via INTRAVENOUS

## 2012-04-04 MED ORDER — CEFAZOLIN SODIUM-DEXTROSE 2-3 GM-% IV SOLR
INTRAVENOUS | Status: DC | PRN
  Administered 2012-04-04: 2 g via INTRAVENOUS

## 2012-04-04 MED ORDER — OXYCODONE-ACETAMINOPHEN 5-325 MG PO TABS: 1.0000 | ORAL_TABLET | ORAL | Status: DC | PRN

## 2012-04-04 MED ORDER — PROPOFOL 10 MG/ML IV BOLUS
INTRAVENOUS | Status: DC | PRN
  Administered 2012-04-04: 300 mg via INTRAVENOUS

## 2012-04-04 MED ORDER — ONDANSETRON HCL 4 MG/2ML IJ SOLN
INTRAMUSCULAR | Status: DC | PRN
  Administered 2012-04-04: 4 mg via INTRAVENOUS

## 2012-04-04 MED ORDER — FENTANYL CITRATE 0.05 MG/ML IJ SOLN
INTRAMUSCULAR | Status: DC | PRN
  Administered 2012-04-04 (×2): 50 ug via INTRAVENOUS
  Administered 2012-04-04: 100 ug via INTRAVENOUS
  Administered 2012-04-04 (×2): 50 ug via INTRAVENOUS

## 2012-04-04 MED ORDER — OXYCODONE-ACETAMINOPHEN 5-325 MG PO TABS
1.0000 | ORAL_TABLET | Freq: Once | ORAL | Status: AC
  Administered 2012-04-04: 1 via ORAL

## 2012-04-04 MED ORDER — BUPIVACAINE HCL (PF) 0.25 % IJ SOLN
INTRAMUSCULAR | Status: DC | PRN
  Administered 2012-04-04: 8 mL

## 2012-04-04 MED ORDER — MIDAZOLAM HCL 5 MG/5ML IJ SOLN
INTRAMUSCULAR | Status: DC | PRN
  Administered 2012-04-04: 2 mg via INTRAVENOUS

## 2012-04-04 MED ORDER — FENTANYL CITRATE 0.05 MG/ML IJ SOLN
25.0000 ug | INTRAMUSCULAR | Status: DC | PRN
  Administered 2012-04-04: 25 ug via INTRAVENOUS
  Administered 2012-04-04: 50 ug via INTRAVENOUS

## 2012-04-04 MED ORDER — LACTATED RINGERS IV SOLN
INTRAVENOUS | Status: DC
  Administered 2012-04-04 (×2): via INTRAVENOUS

## 2012-04-04 MED ORDER — ONDANSETRON HCL 4 MG/2ML IJ SOLN: 4.0000 mg | Freq: Four times a day (QID) | INTRAMUSCULAR | Status: DC | PRN

## 2012-04-04 SURGICAL SUPPLY — 52 items
APL SKNCLS STERI-STRIP NONHPOA (GAUZE/BANDAGES/DRESSINGS)
BANDAGE ELASTIC 3 VELCRO ST LF (GAUZE/BANDAGES/DRESSINGS) ×2 IMPLANT
BANDAGE ELASTIC 4 VELCRO ST LF (GAUZE/BANDAGES/DRESSINGS) ×2 IMPLANT
BANDAGE GAUZE ELAST BULKY 4 IN (GAUZE/BANDAGES/DRESSINGS) ×2 IMPLANT
BENZOIN TINCTURE PRP APPL 2/3 (GAUZE/BANDAGES/DRESSINGS) IMPLANT
BLADE MINI RND TIP GREEN BEAV (BLADE) IMPLANT
BLADE SURG 15 STRL LF DISP TIS (BLADE) ×1 IMPLANT
BLADE SURG 15 STRL SS (BLADE) ×2
BNDG CMPR 9X4 STRL LF SNTH (GAUZE/BANDAGES/DRESSINGS) ×1
BNDG ESMARK 4X9 LF (GAUZE/BANDAGES/DRESSINGS) ×1 IMPLANT
CLOTH BEACON ORANGE TIMEOUT ST (SAFETY) ×2 IMPLANT
CORDS BIPOLAR (ELECTRODE) ×2 IMPLANT
COVER TABLE BACK 60X90 (DRAPES) ×2 IMPLANT
CUFF TOURNIQUET SINGLE 18IN (TOURNIQUET CUFF) ×1 IMPLANT
DECANTER SPIKE VIAL GLASS SM (MISCELLANEOUS) IMPLANT
DRAPE EXTREMITY T 121X128X90 (DRAPE) ×2 IMPLANT
DRAPE OEC MINIVIEW 54X84 (DRAPES) IMPLANT
DRAPE SURG 17X23 STRL (DRAPES) ×2 IMPLANT
DURAPREP 26ML APPLICATOR (WOUND CARE) ×2 IMPLANT
GAUZE SPONGE 4X4 16PLY XRAY LF (GAUZE/BANDAGES/DRESSINGS) IMPLANT
GAUZE XEROFORM 1X8 LF (GAUZE/BANDAGES/DRESSINGS) IMPLANT
GLOVE BIO SURGEON STRL SZ8.5 (GLOVE) ×2 IMPLANT
GLOVE BIOGEL M STRL SZ7.5 (GLOVE) ×1 IMPLANT
GOWN PREVENTION PLUS XLARGE (GOWN DISPOSABLE) ×2 IMPLANT
GOWN PREVENTION PLUS XXLARGE (GOWN DISPOSABLE) ×2 IMPLANT
NDL HYPO 25X1 1.5 SAFETY (NEEDLE) IMPLANT
NEEDLE HYPO 25X1 1.5 SAFETY (NEEDLE) IMPLANT
PACK BASIN DAY SURGERY FS (CUSTOM PROCEDURE TRAY) ×2 IMPLANT
PAD CAST 4YDX4 CTTN HI CHSV (CAST SUPPLIES) ×1 IMPLANT
PADDING CAST ABS 4INX4YD NS (CAST SUPPLIES) ×1
PADDING CAST ABS COTTON 4X4 ST (CAST SUPPLIES) ×1 IMPLANT
PADDING CAST COTTON 4X4 STRL (CAST SUPPLIES) ×2
SHEET MEDIUM DRAPE 40X70 STRL (DRAPES) ×2 IMPLANT
SPONGE GAUZE 4X4 12PLY (GAUZE/BANDAGES/DRESSINGS) ×2 IMPLANT
STOCKINETTE 4X48 STRL (DRAPES) ×2 IMPLANT
STRIP CLOSURE SKIN 1/2X4 (GAUZE/BANDAGES/DRESSINGS) IMPLANT
SUT ETHILON 3 0 PS 1 (SUTURE) IMPLANT
SUT ETHILON 5 0 PS 2 18 (SUTURE) IMPLANT
SUT MON AB 4-0 PC3 18 (SUTURE) IMPLANT
SUT PROLENE 3 0 PS 2 (SUTURE) IMPLANT
SUT VIC AB 0 CT3 27 (SUTURE) IMPLANT
SUT VIC AB 0 SH 27 (SUTURE) IMPLANT
SUT VIC AB 2-0 SH 27 (SUTURE)
SUT VIC AB 2-0 SH 27XBRD (SUTURE) IMPLANT
SUT VIC AB 3-0 PS1 18 (SUTURE)
SUT VIC AB 3-0 PS1 18XBRD (SUTURE) IMPLANT
SUT VICRYL 4-0 PS2 18IN ABS (SUTURE) IMPLANT
SUT VICRYL RAPIDE 4/0 PS 2 (SUTURE) IMPLANT
SYR BULB 3OZ (MISCELLANEOUS) ×2 IMPLANT
SYRINGE 10CC LL (SYRINGE) IMPLANT
UNDERPAD 30X30 INCONTINENT (UNDERPADS AND DIAPERS) ×2 IMPLANT
WATER STERILE IRR 1000ML POUR (IV SOLUTION) ×2 IMPLANT

## 2012-04-04 NOTE — Brief Op Note (Signed)
04/04/2012  2:18 PM  PATIENT:  Kerry Wiley  36 y.o. male  PRE-OPERATIVE DIAGNOSIS:  right small metacarpal fracture  POST-OPERATIVE DIAGNOSIS:  right small metacarpal fracture  PROCEDURE:  Procedure(s) (LRB) with comments: HARDWARE REMOVAL (Right) - right hand hardware removal/tenolysis  SURGEON:  Surgeon(s) and Role:    * Marlowe Shores, MD - Primary  PHYSICIAN ASSISTANT:   ASSISTANTS: none   ANESTHESIA:   general  EBL:  Total I/O In: 1000 [I.V.:1000] Out: -   BLOOD ADMINISTERED:none  DRAINS: none   LOCAL MEDICATIONS USED:  MARCAINE   8cc  SPECIMEN:  No Specimen  DISPOSITION OF SPECIMEN:  N/A  COUNTS:  YES  TOURNIQUET:   Total Tourniquet Time Documented: Upper Arm (Right) - 17 minutes  DICTATION: .Other Dictation: Dictation Number [id number]  PLAN OF CARE: Discharge to home after PACU  PATIENT DISPOSITION:  PACU - hemodynamically stable.   Delay start of Pharmacological VTE agent (>24hrs) due to surgical blood loss or risk of bleeding: not applicable

## 2012-04-04 NOTE — Op Note (Signed)
See dcictataed note [phone]

## 2012-04-04 NOTE — Anesthesia Procedure Notes (Signed)
Procedure Name: LMA Insertion Date/Time: 04/04/2012 1:50 PM Performed by: Zenia Resides D Pre-anesthesia Checklist: Patient identified, Emergency Drugs available, Suction available, Patient being monitored and Timeout performed Patient Re-evaluated:Patient Re-evaluated prior to inductionOxygen Delivery Method: Circle System Utilized Preoxygenation: Pre-oxygenation with 100% oxygen Intubation Type: IV induction Ventilation: Mask ventilation without difficulty LMA: LMA inserted LMA Size: 5.0 Number of attempts: 1 Airway Equipment and Method: bite block Placement Confirmation: positive ETCO2 and breath sounds checked- equal and bilateral Tube secured with: Tape Dental Injury: Teeth and Oropharynx as per pre-operative assessment

## 2012-04-04 NOTE — H&P (Signed)
Kerry Wiley is an 36 y.o. male.   Chief Complaint: right hand pain HPI: as above s/p ORIF right hand with retained hardware  Past Medical History  Diagnosis Date  . GERD (gastroesophageal reflux disease)     states he has some but not "diagnosed"  . History of kidney stones     Past Surgical History  Procedure Date  . Appendectomy   . Hand surgery   . Fracture surgery   . Joint replacement     knee and ankle replacement-1997  . Cholecystectomy     2011    History reviewed. No pertinent family history. Social History:  reports that he has been smoking Cigarettes.  He has been smoking about 1 pack per day. He has never used smokeless tobacco. He reports that he drinks alcohol. He reports that he does not use illicit drugs.  Allergies: No Known Allergies  Medications Prior to Admission  Medication Sig Dispense Refill  . oxyCODONE-acetaminophen (PERCOCET/ROXICET) 5-325 MG per tablet Take 1 tablet by mouth every 4 (four) hours as needed.        Results for orders placed during the hospital encounter of 04/04/12 (from the past 48 hour(s))  POCT HEMOGLOBIN-HEMACUE     Status: Normal   Collection Time   04/04/12  1:28 PM      Component Value Range Comment   Hemoglobin 13.9  13.0 - 17.0 g/dL    No results found.  Review of Systems  All other systems reviewed and are negative.    Blood pressure 140/91, pulse 75, temperature 98.6 F (37 C), temperature source Oral, resp. rate 18, height 6\' 1"  (1.854 m), weight 109.77 kg (242 lb), SpO2 99.00%. Physical Exam  Constitutional: He is oriented to person, place, and time. He appears well-developed and well-nourished.  HENT:  Head: Normocephalic and atraumatic.  Cardiovascular: Normal rate.   Respiratory: Effort normal.  Musculoskeletal:       Right hand: He exhibits tenderness.  Neurological: He is alert and oriented to person, place, and time.  Skin: Skin is warm.  Psychiatric: He has a normal mood and affect. His behavior  is normal. Judgment and thought content normal.     Assessment/Plan As above  Plan hardware removal and tenolysis  Kitt Ledet A 04/04/2012, 1:36 PM

## 2012-04-04 NOTE — Transfer of Care (Signed)
Immediate Anesthesia Transfer of Care Note  Patient: Kerry Wiley  Procedure(s) Performed: Procedure(s) (LRB) with comments: HARDWARE REMOVAL (Right) - right hand hardware removal/tenolysis  Patient Location: PACU  Anesthesia Type: General  Level of Consciousness: awake, alert  and oriented  Airway & Oxygen Therapy: Patient Spontanous Breathing and Patient connected to face mask oxygen  Post-op Assessment: Report given to PACU RN and Post -op Vital signs reviewed and stable  Post vital signs: Reviewed and stable  Complications: No apparent anesthesia complications

## 2012-04-04 NOTE — Anesthesia Postprocedure Evaluation (Signed)
Anesthesia Post Note  Patient: Kerry Wiley  Procedure(s) Performed: Procedure(s) (LRB): HARDWARE REMOVAL (Right)  Anesthesia type: General  Patient location: PACU  Post pain: Pain level controlled and Adequate analgesia  Post assessment: Post-op Vital signs reviewed, Patient's Cardiovascular Status Stable, Respiratory Function Stable, Patent Airway and Pain level controlled  Last Vitals:  Filed Vitals:   04/04/12 1438  BP:   Pulse:   Temp: 36.5 C  Resp: 20    Post vital signs: Reviewed and stable  Level of consciousness: awake, alert  and oriented  Complications: No apparent anesthesia complications

## 2012-04-04 NOTE — Anesthesia Preprocedure Evaluation (Signed)
Anesthesia Evaluation  Patient identified by MRN, date of birth, ID band Patient awake    Reviewed: Allergy & Precautions, H&P , NPO status , Patient's Chart, lab work & pertinent test results  Airway Mallampati: II  Neck ROM: full    Dental   Pulmonary Current Smoker,          Cardiovascular     Neuro/Psych    GI/Hepatic GERD-  ,  Endo/Other  obese  Renal/GU      Musculoskeletal   Abdominal   Peds  Hematology   Anesthesia Other Findings   Reproductive/Obstetrics                           Anesthesia Physical Anesthesia Plan  ASA: II  Anesthesia Plan: General   Post-op Pain Management:    Induction: Intravenous  Airway Management Planned: LMA  Additional Equipment:   Intra-op Plan:   Post-operative Plan:   Informed Consent: I have reviewed the patients History and Physical, chart, labs and discussed the procedure including the risks, benefits and alternatives for the proposed anesthesia with the patient or authorized representative who has indicated his/her understanding and acceptance.     Plan Discussed with: CRNA and Surgeon  Anesthesia Plan Comments:         Anesthesia Quick Evaluation

## 2012-04-05 ENCOUNTER — Encounter (HOSPITAL_BASED_OUTPATIENT_CLINIC_OR_DEPARTMENT_OTHER): Payer: Self-pay | Admitting: Orthopedic Surgery

## 2012-04-05 NOTE — Op Note (Signed)
[name], Kerry Wiley  MEDICAL RECORD NO.:  [id number]  LOCATION:                                 FACILITY:  PHYSICIAN:  Kerry Wiley, Kerry WileyDATE OF BIRTH:  04/27/1976  DATE OF PROCEDURE:  04/04/2012 DATE OF DISCHARGE:                              OPERATIVE REPORT   PREOPERATIVE DIAGNOSIS:  Retained painful hardware, right hand.  POSTOP:  Retained painful hardware, right hand.  PROCEDURE:  Hardware removal x2 as well as extensor tenolysis right hand, right ring finger.  SURGEON:  Kerry Pais. Mina Marble, Kerry Wiley  ASSISTANT:  None.  ANESTHESIA:  General.  COMPLICATION:  No complication.  DRAINS: No drains.  The patient was taken to the operating suite.  After induction of adequate general anesthesia, right upper extremity was prepped and draped in sterile fashion.  Esmarch was used to exsanguinate the limb. Tourniquet was inflated to 250 mmHg.  At this point in time, two 0.45 K- wires that were used to fix a small metacarpal base fracture were removed without undue difficulty.  An incision was then made dorsally separately over the fourth metacarpal using his previous incision.  Skin was incised sharply.  Dissection was carried down to the [facility] tendons of the ring finger.  Care was taken to identify and preserve the juncture between the [facility] of the ring and small fingers.  Careful dissection was taken along the ulnar border of the EDC tendon.  The plate was identified and there was 4-hole semi tubular plate that had been placed prior for fixation of the fourth metacarpal.  This was removed with out undue difficulty.  Wound was irrigated and loosely closed with 4-0 Vicryl Rapide subcuticular stitch.  Prior to wound closure.  The EDC tendon to the ring finger was tenolysed proximally and distally, and gentle manipulation of the PIP joint was undertaken.  The patient tolerated all procedures well, was placed in sterile dressing,  4x4s, fluffs, and a volar splint, and was taken to recovery room in stable fashion.    Kerry Pais Mina Wiley, M.D.    MAW/MEDQ  D:  04/04/2012  T:  04/05/2012  Job:  [id number]

## 2012-08-30 ENCOUNTER — Emergency Department (HOSPITAL_COMMUNITY)
Admission: EM | Admit: 2012-08-30 | Discharge: 2012-08-30 | Disposition: A | Discharge status: Home / Self Care | Payer: Self-pay | Attending: Emergency Medicine | Admitting: Emergency Medicine

## 2012-08-30 ENCOUNTER — Encounter (HOSPITAL_COMMUNITY): Payer: Self-pay | Admitting: Emergency Medicine

## 2012-08-30 ENCOUNTER — Emergency Department (HOSPITAL_COMMUNITY): Payer: Self-pay

## 2012-08-30 DIAGNOSIS — F17200 Nicotine dependence, unspecified, uncomplicated: Secondary | ICD-10-CM | POA: Insufficient documentation

## 2012-08-30 DIAGNOSIS — R109 Unspecified abdominal pain: Secondary | ICD-10-CM | POA: Insufficient documentation

## 2012-08-30 DIAGNOSIS — Z8719 Personal history of other diseases of the digestive system: Secondary | ICD-10-CM | POA: Insufficient documentation

## 2012-08-30 DIAGNOSIS — Z87442 Personal history of urinary calculi: Secondary | ICD-10-CM | POA: Insufficient documentation

## 2012-08-30 DIAGNOSIS — R319 Hematuria, unspecified: Secondary | ICD-10-CM | POA: Insufficient documentation

## 2012-08-30 LAB — URINALYSIS, ROUTINE W REFLEX MICROSCOPIC
Bilirubin Urine: NEGATIVE
Glucose, UA: NEGATIVE mg/dL
Ketones, ur: 15 mg/dL — AB
Leukocytes, UA: NEGATIVE
Protein, ur: NEGATIVE mg/dL

## 2012-08-30 LAB — CBC WITH DIFFERENTIAL/PLATELET
Basophils Absolute: 0 10*3/uL (ref 0.0–0.1)
Eosinophils Absolute: 0 10*3/uL (ref 0.0–0.7)
Eosinophils Relative: 0 % (ref 0–5)
MCH: 27.8 pg (ref 26.0–34.0)
MCV: 81.5 fL (ref 78.0–100.0)
Platelets: 219 10*3/uL (ref 150–400)
RDW: 14.9 % (ref 11.5–15.5)

## 2012-08-30 LAB — BASIC METABOLIC PANEL
Calcium: 9.2 mg/dL (ref 8.4–10.5)
GFR calc non Af Amer: 89 mL/min — ABNORMAL LOW (ref >90)
Sodium: 136 mEq/L (ref 135–145)

## 2012-08-30 LAB — URINE MICROSCOPIC-ADD ON

## 2012-08-30 MED ORDER — ONDANSETRON HCL 4 MG/2ML IJ SOLN
4.0000 mg | Freq: Once | INTRAMUSCULAR | Status: AC
  Administered 2012-08-30: 4 mg via INTRAVENOUS
  Filled 2012-08-30: qty 2

## 2012-08-30 MED ORDER — HYDROMORPHONE HCL PF 1 MG/ML IJ SOLN
1.0000 mg | Freq: Once | INTRAMUSCULAR | Status: AC
  Administered 2012-08-30: 1 mg via INTRAVENOUS
  Filled 2012-08-30: qty 1

## 2012-08-30 MED ORDER — SODIUM CHLORIDE 0.9 % IV SOLN
Freq: Once | INTRAVENOUS | Status: AC
  Administered 2012-08-30: 11:00:00 via INTRAVENOUS

## 2012-08-30 MED ORDER — HYDROCODONE-ACETAMINOPHEN 5-325 MG PO TABS: 1.0000 | ORAL_TABLET | ORAL | Status: DC | PRN

## 2012-08-30 NOTE — ED Notes (Signed)
C/o right flank pain now radiating into right groin & hematuria onset Saturday. States pain worsening & feels same as kidney stone pain.

## 2012-08-30 NOTE — ED Notes (Signed)
Pt c/o right sided flank pain with hematuria x 3 days; pt sts hx of kidney stone and feels same

## 2012-08-30 NOTE — ED Provider Notes (Signed)
I have personally seen and examined the patient.  I have discussed the plan of care with the resident.  I have reviewed the documentation on PMH/FH/Soc. History.  I have reviewed the documentation of the resident and agree.  Pt improving on my exam.  He appears stable for d/c home  Joya Gaskins, MD 08/30/12 [phone]

## 2012-08-30 NOTE — ED Provider Notes (Signed)
History     CSN: [id number]  Arrival date & time 08/30/12  0905   First MD Initiated Contact with Patient 08/30/12 1009      Chief Complaint  Patient presents with  . Flank Pain  . Hematuria   HPI Kerry Wiley is a 37 y.o. male who presents to the ED with R sided flank pain and hematuria for last 2.5 days.  Pain moderate in severity.  Waxing and waning.  Similar to previous kidney stones.  Was concerned that this could be another.  No polyuria.  Some dysuria.  No fevers.  No malodor to urine.  No other symptoms.  Past Medical History  Diagnosis Date  . GERD (gastroesophageal reflux disease)     states he has some but not "diagnosed"  . History of kidney stones     Past Surgical History  Procedure Laterality Date  . Appendectomy    . Hand surgery    . Fracture surgery    . 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 Cholecystectomy      2011  . Hardware removal  04/04/2012    Procedure: HARDWARE REMOVAL;  Surgeon: Marlowe Shores, MD;  Location: [facility];  Service: Orthopedics;  Laterality: Right;  right hand hardware removal/tenolysis    History reviewed. No pertinent family history.  History  Substance Use Topics  . Smoking status: Current Every Day Smoker -- 1.00 packs/day    Types: Cigarettes  . Smokeless tobacco: Never Used  . Alcohol Use: Yes     Comment: social drinkier      Review of Systems  Constitutional: Negative for fever and chills.  HENT: Negative for congestion, sore throat and neck pain.   Respiratory: Negative for cough.   Gastrointestinal: Negative for nausea, vomiting, abdominal pain, diarrhea and constipation.  Endocrine: Negative for polyuria.  Genitourinary: Positive for hematuria. Negative for dysuria.  Skin: Negative for rash.  Neurological: Negative for headaches.  Psychiatric/Behavioral: Negative.   All other systems reviewed and are negative.    Allergies  Review of patient's allergies  indicates no known allergies.  Home Medications  No current outpatient prescriptions on file.  BP 161/97  Pulse 102  Temp(Src) 99.7 F (37.6 C) (Oral)  Resp 20  SpO2 98%  Physical Exam  Nursing note and vitals reviewed. Constitutional: He is oriented to person, place, and time. He appears well-developed and well-nourished. No distress.  HENT:  Head: Normocephalic and atraumatic.  Right Ear: External ear normal.  Left Ear: External ear normal.  Mouth/Throat: Oropharynx is clear and moist. No oropharyngeal exudate.  Eyes: Conjunctivae are normal. Pupils are equal, round, and reactive to light. Right eye exhibits no discharge.  Neck: Normal range of motion. Neck supple. No tracheal deviation present.  Cardiovascular: Normal rate, regular rhythm and intact distal pulses.   Pulmonary/Chest: Effort normal. No respiratory distress. He has no wheezes. He has no rales.  Abdominal: Soft. He exhibits no distension. There is no tenderness. There is CVA tenderness (R). There is no rebound and no guarding. Hernia confirmed negative in the right inguinal area and confirmed negative in the left inguinal area.  Genitourinary: Testes normal and penis normal.  Musculoskeletal: Normal range of motion.  Neurological: He is alert and oriented to person, place, and time.  Skin: Skin is warm and dry. No rash noted. He is not diaphoretic.  Psychiatric: He has a normal mood and affect.    ED Course  Procedures (including  critical care time)  Labs Reviewed  URINALYSIS, ROUTINE W REFLEX MICROSCOPIC - Abnormal; Notable for the following:    Hgb urine dipstick MODERATE (*)    Ketones, ur 15 (*)    All other components within normal limits  BASIC METABOLIC PANEL - Abnormal; Notable for the following:    GFR calc non Af Amer 89 (*)    All other components within normal limits  URINE MICROSCOPIC-ADD ON  CBC WITH DIFFERENTIAL   Ct Abdomen Pelvis Wo Contrast  08/30/2012  *RADIOLOGY REPORT*  Clinical  Data: Right flank pain and hematuria.  CT ABDOMEN AND PELVIS WITHOUT CONTRAST  Technique:  Multidetector CT imaging of the abdomen and pelvis was performed following the standard protocol without intravenous contrast.  Comparison: 05/29/2011.  Findings: The lung bases are clear.  No pleural effusion.  The distal esophagus is unremarkable.  The unenhanced appearance of the liver is unremarkable and stable. No focal hepatic lesions or intrahepatic biliary dilatation.  The gallbladder is surgically absent.  No common bile duct dilatation. The pancreas is unremarkable.  The spleen is normal in size.  No focal lesions.  The adrenal glands are normal and stable.  There is a large lobulated cyst associated with the upper pole region of the left kidney.  This is stable.  No renal or obstructing ureteral calculi and no bladder calculi.  The bladder, prostate gland and seminal vesicles are unremarkable.  The stomach, duodenum, small bowel and colon are grossly normal without oral contrast.  No mesenteric or retroperitoneal mass or adenopathy.  Small scattered lymph nodes are noted.  The aorta is normal in caliber. Surgical changes from an appendectomy are noted.  No pelvic mass, adenopathy or free pelvic fluid collections.  No inguinal mass or hernia.  The bony structures are unremarkable and stable.  IMPRESSION: Unremarkable CT examination of the abdomen/pelvis. Stable large upper pole left renal cyst. No renal or obstructing ureteral calculi.   Original Report Authenticated By: Rudie Meyer, M.D.      1. Hematuria   2. Flank pain       MDM   Kerry Wiley is a 37 y.o. male who presented to the ED with R flank pain and hematuria similar to previous kidney stones.  Exam with R tenderness.  UA positive for blood.  No infection. CT without stone.  Likely a passed stone but needs urology f/u for stones and for hematuria.  Patient safe for discharge.  Patient discharged.       Arloa Koh, MD 08/30/12 1352

## 2012-08-30 NOTE — ED Notes (Signed)
Patient transported to CT 

## 2012-12-12 ENCOUNTER — Emergency Department (HOSPITAL_COMMUNITY)
Admission: EM | Admit: 2012-12-12 | Discharge: 2012-12-12 | Disposition: A | Discharge status: Home / Self Care | Payer: Medicaid Other | Attending: Emergency Medicine | Admitting: Emergency Medicine

## 2012-12-12 ENCOUNTER — Emergency Department (HOSPITAL_COMMUNITY): Payer: Medicaid Other

## 2012-12-12 ENCOUNTER — Encounter (HOSPITAL_COMMUNITY): Payer: Self-pay | Admitting: Emergency Medicine

## 2012-12-12 DIAGNOSIS — Z87442 Personal history of urinary calculi: Secondary | ICD-10-CM | POA: Insufficient documentation

## 2012-12-12 DIAGNOSIS — S058X9A Other injuries of unspecified eye and orbit, initial encounter: Secondary | ICD-10-CM | POA: Insufficient documentation

## 2012-12-12 DIAGNOSIS — S6991XA Unspecified injury of right wrist, hand and finger(s), initial encounter: Secondary | ICD-10-CM

## 2012-12-12 DIAGNOSIS — K219 Gastro-esophageal reflux disease without esophagitis: Secondary | ICD-10-CM | POA: Insufficient documentation

## 2012-12-12 DIAGNOSIS — F17200 Nicotine dependence, unspecified, uncomplicated: Secondary | ICD-10-CM | POA: Insufficient documentation

## 2012-12-12 DIAGNOSIS — S6990XA Unspecified injury of unspecified wrist, hand and finger(s), initial encounter: Secondary | ICD-10-CM | POA: Insufficient documentation

## 2012-12-12 DIAGNOSIS — S0502XA Injury of conjunctiva and corneal abrasion without foreign body, left eye, initial encounter: Secondary | ICD-10-CM

## 2012-12-12 MED ORDER — IBUPROFEN 600 MG PO TABS: 600.0000 mg | ORAL_TABLET | Freq: Four times a day (QID) | ORAL | Status: DC | PRN

## 2012-12-12 MED ORDER — HYDROCODONE-ACETAMINOPHEN 5-325 MG PO TABS: 2.0000 | ORAL_TABLET | ORAL | Status: DC | PRN

## 2012-12-12 MED ORDER — POLYMYXIN B-TRIMETHOPRIM 10000-0.1 UNIT/ML-% OP SOLN
1.0000 [drp] | OPHTHALMIC | Status: DC
  Administered 2012-12-12: 1 [drp] via OPHTHALMIC
  Filled 2012-12-12: qty 10

## 2012-12-12 MED ORDER — HYDROCODONE-ACETAMINOPHEN 5-325 MG PO TABS
1.0000 | ORAL_TABLET | Freq: Once | ORAL | Status: AC
  Administered 2012-12-12: 1 via ORAL
  Filled 2012-12-12: qty 1
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
## 2012-12-13 NOTE — ED Provider Notes (Signed)
Medical screening examination/treatment/procedure(s) were performed by non-physician practitioner and as supervising physician I was immediately available for consultation/collaboration.   Carleene Cooper III, MD 12/13/12 [phone]

## 2013-08-15 ENCOUNTER — Emergency Department (HOSPITAL_COMMUNITY)
Admission: EM | Admit: 2013-08-15 | Discharge: 2013-08-15 | Disposition: A | Discharge status: Home / Self Care | Payer: Medicaid Other | Attending: Emergency Medicine | Admitting: Emergency Medicine

## 2013-08-15 ENCOUNTER — Emergency Department (HOSPITAL_COMMUNITY): Payer: Medicaid Other

## 2013-08-15 ENCOUNTER — Encounter (HOSPITAL_COMMUNITY): Payer: Self-pay | Admitting: Emergency Medicine

## 2013-08-15 DIAGNOSIS — M545 Low back pain, unspecified: Secondary | ICD-10-CM | POA: Insufficient documentation

## 2013-08-15 DIAGNOSIS — Z87442 Personal history of urinary calculi: Secondary | ICD-10-CM | POA: Insufficient documentation

## 2013-08-15 DIAGNOSIS — M5380 Other specified dorsopathies, site unspecified: Secondary | ICD-10-CM | POA: Insufficient documentation

## 2013-08-15 DIAGNOSIS — M549 Dorsalgia, unspecified: Secondary | ICD-10-CM

## 2013-08-15 DIAGNOSIS — Z8719 Personal history of other diseases of the digestive system: Secondary | ICD-10-CM | POA: Insufficient documentation

## 2013-08-15 DIAGNOSIS — F17200 Nicotine dependence, unspecified, uncomplicated: Secondary | ICD-10-CM | POA: Insufficient documentation

## 2013-08-15 MED ORDER — OXYCODONE-ACETAMINOPHEN 5-325 MG PO TABS: 1.0000 | ORAL_TABLET | Freq: Four times a day (QID) | ORAL | Status: DC | PRN

## 2013-08-15 MED ORDER — OXYCODONE-ACETAMINOPHEN 5-325 MG PO TABS
1.0000 | ORAL_TABLET | Freq: Once | ORAL | Status: AC
  Administered 2013-08-15: 2 via ORAL
  Filled 2013-08-15: qty 2

## 2013-08-15 MED ORDER — CYCLOBENZAPRINE HCL 10 MG PO TABS: 10.0000 mg | ORAL_TABLET | Freq: Two times a day (BID) | ORAL | Status: DC | PRN

## 2013-08-15 MED ORDER — IBUPROFEN 800 MG PO TABS: 800.0000 mg | ORAL_TABLET | Freq: Three times a day (TID) | ORAL | Status: DC

## 2013-08-15 NOTE — ED Notes (Signed)
Patient stated started having back pain on Saturday.  Patient doesn't recall any injury or anything.  Patient states pain L back going into L leg.

## 2013-08-15 NOTE — ED Provider Notes (Signed)
CSN: [id number]     Arrival date & time 08/15/13  0824 History   First MD Initiated Contact with Patient 08/15/13 [phone]     Chief Complaint  Patient presents with  . Back Pain   (Consider location/radiation/quality/duration/timing/severity/associated sxs/prior Treatment) HPI Comments: Patient is a 38 yo M PMHx significant for GERD and nephrolithiasis presenting to the ED For four days of severe low back pain with radiation into both legs, left worse than right. Patient states the pain is tight and feels like "something is grinding in my back." He describes the radiating pain as burning in nature. 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 Patient states sitting still alleviating his pain minimally. Movement, palpation aggravate his pain. Denies any fevers or chills, weakness, hx of IVDA, hx of cancer, bladder or bowel incontinence, hx of back surgeries.   Patient is a 38 y.o. male presenting with back pain.  Back Pain Associated symptoms: no fever     Past Medical History  Diagnosis Date  . GERD (gastroesophageal reflux disease)     states he has some but not "diagnosed"  . History of kidney stones    Past Surgical History  Procedure Laterality Date  . Appendectomy    . Hand surgery    . Fracture surgery    . 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 Cholecystectomy      2011  . Hardware removal  04/04/2012    Procedure: HARDWARE REMOVAL;  Surgeon: Schuyler Amor, MD;  Location: [facility];  Service: Orthopedics;  Laterality: Right;  right hand hardware removal/tenolysis   No family history on file. History  Substance Use Topics  . Smoking status: Current Every Day Smoker -- 1.00 packs/day    Types: Cigarettes  . Smokeless tobacco: Never Used  . Alcohol Use: Yes     Comment: social drinkier    Review of Systems  Constitutional: Negative for fever and chills.   Musculoskeletal: Positive for back pain.  All other systems reviewed and are negative.    Allergies  Review of patient's allergies indicates no known allergies.  Home Medications   Current Outpatient Rx  Name  Route  Sig  Dispense  Refill  . acetaminophen (TYLENOL) 500 MG tablet   Oral   Take 1,500 mg by mouth every 6 (six) hours as needed (for pain).         . cyclobenzaprine (FLEXERIL) 10 MG tablet   Oral   Take 1 tablet (10 mg total) by mouth 2 (two) times daily as needed for muscle spasms.   5 tablet   0   . ibuprofen (ADVIL,MOTRIN) 800 MG tablet   Oral   Take 1 tablet (800 mg total) by mouth 3 (three) times daily.   21 tablet   0   . oxyCODONE-acetaminophen (PERCOCET/ROXICET) 5-325 MG per tablet   Oral   Take 1-2 tablets by mouth every 6 (six) hours as needed for severe pain.   20 tablet   0    BP 158/89  Pulse 77  Temp(Src) 97.9 F (36.6 C) (Oral)  Resp 18  Ht 6\' 2"  (1.88 m)  Wt 235 lb (106.595 kg)  BMI 30.16 kg/m2  SpO2 99% Physical Exam  Constitutional: He is oriented to person, place, and time. He appears well-developed and well-nourished. No distress.  HENT:  Head: Normocephalic and atraumatic.  Right Ear: External ear  normal.  Left Ear: External ear normal.  Nose: Nose normal.  Mouth/Throat: Oropharynx is clear and moist. No oropharyngeal exudate.  Eyes: Conjunctivae and EOM are normal. Pupils are equal, round, and reactive to light.  Neck: Normal range of motion. Neck supple.  Cardiovascular: Normal rate, regular rhythm, normal heart sounds and intact distal pulses.   Pulmonary/Chest: Effort normal and breath sounds normal. No respiratory distress.  Abdominal: Soft. There is no tenderness.  Musculoskeletal:       Cervical back: Normal.       Thoracic back: Normal.       Lumbar back: He exhibits decreased range of motion (d/t pain), tenderness, bony tenderness and spasm. He exhibits no swelling, no edema, no deformity, no laceration, no pain  and normal pulse.  Neurological: He is alert and oriented to person, place, and time. He has normal strength. No cranial nerve deficit or sensory deficit. Gait normal. GCS eye subscore is 4. GCS verbal subscore is 5. GCS motor subscore is 6.  No pronator drift. Bilateral heel-knee-shin intact. Sharp and Dull sensation intact.   Skin: Skin is warm and dry. He is not diaphoretic.    ED Course  Procedures (including critical care time) Medications  oxyCODONE-acetaminophen (PERCOCET/ROXICET) 5-325 MG per tablet 1-2 tablet (2 tablets Oral Given 08/15/13 0909)    Labs Review Labs Reviewed - No data to display Imaging Review Dg Lumbar Spine Complete  08/15/2013   CLINICAL DATA:  Back pain, fell 3 days ago with sharp pain in the back and down left leg  EXAM: LUMBAR SPINE - COMPLETE 4+ VIEW  COMPARISON:  None.  FINDINGS: There is no evidence of lumbar spine fracture. Alignment is normal. Intervertebral disc spaces are maintained.  IMPRESSION: Negative.   Electronically Signed   By: Skipper Cliche M.D.   On: 08/15/2013 09:37    EKG Interpretation   None       MDM   1. Back pain     Filed Vitals:   08/15/13 0837  BP: 158/89  Pulse: 77  Temp: 97.9 F (36.6 C)  Resp: 18    Afebrile, NAD, non-toxic appearing, AAOx4.   Patient with back pain.  No neurological deficits and normal neuro exam.  Patient can walk but states is painful.  No loss of bowel or bladder control.  No concern for cauda equina.  No fever, night sweats, weight loss, h/o cancer, IVDU.  RICE protocol and pain medicine indicated and discussed with patient.      Harlow Mares, PA-C 08/15/13 1630

## 2013-08-15 NOTE — Discharge Instructions (Signed)
Please follow up with your primary care physician in 1-2 days. If you do not have one please call the [facility] number listed above. Please follow up with the Orthopedist to schedule a follow up appointment. Please take pain medication as prescribed and as needed for pain. Please do not drive on narcotic pain medication. Please read all discharge instructions and return precautions.    Back Pain, Adult Low back pain is very common. About 1 in 5 people have back pain.The cause of low back pain is rarely dangerous. The pain often gets better over time.About half of people with a sudden onset of back pain feel better in just 2 weeks. About 8 in 10 people feel better by 6 weeks.  CAUSES Some common causes of back pain include:  Strain of the muscles or ligaments supporting the spine.  Wear and tear (degeneration) of the spinal discs.  Arthritis.  Direct injury to the back. DIAGNOSIS Most of the time, the direct cause of low back pain is not known.However, back pain can be treated effectively even when the exact cause of the pain is unknown.Answering your caregiver's questions about your overall health and symptoms is one of the most accurate ways to make sure the cause of your pain is not dangerous. If your caregiver needs more information, he or she may order lab work or imaging tests (X-rays or MRIs).However, even if imaging tests show changes in your back, this usually does not require surgery. HOME CARE INSTRUCTIONS For many people, back pain returns.Since low back pain is rarely dangerous, it is often a condition that people can learn to [facility] their own.   Remain active. It is stressful on the back to sit or stand in one place. Do not sit, drive, or stand in one place for more than 30 minutes at a time. Take short walks on level surfaces as soon as pain allows.Try to increase the length of time you walk each day.  Do not stay in bed.Resting more than 1 or 2  days can delay your recovery.  Do not avoid exercise or work.Your body is made to move.It is not dangerous to be active, even though your back may hurt.Your back will likely heal faster if you return to being active before your pain is gone.  Pay attention to your body when you bend and lift. Many people have less discomfortwhen lifting if they bend their knees, keep the load close to their bodies,and avoid twisting. Often, the most comfortable positions are those that put less stress on your recovering back.  Find a comfortable position to sleep. Use a firm mattress and lie on your side with your knees slightly bent. If you lie on your back, put a pillow under your knees.  Only take over-the-counter or prescription medicines as directed by your caregiver. Over-the-counter medicines to reduce pain and inflammation are often the most helpful.Your caregiver may prescribe muscle relaxant drugs.These medicines help dull your pain so you can more quickly return to your normal activities and healthy exercise.  Put ice on the injured area.  Put ice in a plastic bag.  Place a towel between your skin and the bag.  Leave the ice on for 15-20 minutes, 03-04 times a day for the first 2 to 3 days. After that, ice and heat may be alternated to reduce pain and spasms.  Ask your caregiver about trying back exercises and gentle massage. This may be of some benefit.  Avoid feeling anxious  or stressed.Stress increases muscle tension and can worsen back pain.It is important to recognize when you are anxious or stressed and learn ways to manage it.Exercise is a great option. SEEK MEDICAL CARE IF:  You have pain that is not relieved with rest or medicine.  You have pain that does not improve in 1 week.  You have new symptoms.  You are generally not feeling well. SEEK IMMEDIATE MEDICAL CARE IF:   You have pain that radiates from your back into your legs.  You develop new bowel or bladder  control problems.  You have unusual weakness or numbness in your arms or legs.  You develop nausea or vomiting.  You develop abdominal pain.  You feel faint. Document Released: 06/29/2005 Document Revised: 12/29/2011 Document Reviewed: 11/17/2010 [facility] Patient Information 2014 [place], [place].

## 2013-08-16 NOTE — ED Provider Notes (Signed)
Medical screening examination/treatment/procedure(s) were performed by non-physician practitioner and as supervising physician I was immediately available for consultation/collaboration.  EKG Interpretation   None         Deshauna Cayson B. Uma Jerde, MD 08/16/13 1612 

## 2013-10-04 ENCOUNTER — Encounter (HOSPITAL_COMMUNITY): Payer: Self-pay | Admitting: Emergency Medicine

## 2013-10-04 ENCOUNTER — Emergency Department (HOSPITAL_COMMUNITY)
Admission: EM | Admit: 2013-10-04 | Discharge: 2013-10-04 | Disposition: A | Discharge status: Home / Self Care | Payer: Medicaid Other | Attending: Emergency Medicine | Admitting: Emergency Medicine

## 2013-10-04 DIAGNOSIS — S335XXA Sprain of ligaments of lumbar spine, initial encounter: Secondary | ICD-10-CM | POA: Insufficient documentation

## 2013-10-04 DIAGNOSIS — Z87442 Personal history of urinary calculi: Secondary | ICD-10-CM | POA: Insufficient documentation

## 2013-10-04 DIAGNOSIS — Y929 Unspecified place or not applicable: Secondary | ICD-10-CM | POA: Insufficient documentation

## 2013-10-04 DIAGNOSIS — Y9389 Activity, other specified: Secondary | ICD-10-CM | POA: Insufficient documentation

## 2013-10-04 DIAGNOSIS — K219 Gastro-esophageal reflux disease without esophagitis: Secondary | ICD-10-CM | POA: Insufficient documentation

## 2013-10-04 DIAGNOSIS — X500XXA Overexertion from strenuous movement or load, initial encounter: Secondary | ICD-10-CM | POA: Insufficient documentation

## 2013-10-04 DIAGNOSIS — F17200 Nicotine dependence, unspecified, uncomplicated: Secondary | ICD-10-CM | POA: Insufficient documentation

## 2013-10-04 DIAGNOSIS — S39012A Strain of muscle, fascia and tendon of lower back, initial encounter: Secondary | ICD-10-CM

## 2013-10-04 LAB — URINALYSIS, ROUTINE W REFLEX MICROSCOPIC
BILIRUBIN URINE: NEGATIVE
GLUCOSE, UA: NEGATIVE mg/dL
HGB URINE DIPSTICK: NEGATIVE
KETONES UR: NEGATIVE mg/dL
Leukocytes, UA: NEGATIVE
NITRITE: NEGATIVE
PH: 5.5 (ref 5.0–8.0)
Protein, ur: NEGATIVE mg/dL
SPECIFIC GRAVITY, URINE: 1.012 (ref 1.005–1.030)
Urobilinogen, UA: 0.2 mg/dL (ref 0.0–1.0)

## 2013-10-04 MED ORDER — OXYCODONE-ACETAMINOPHEN 5-325 MG PO TABS
2.0000 | ORAL_TABLET | Freq: Once | ORAL | Status: AC
  Administered 2013-10-04: 2 via ORAL
  Filled 2013-10-04: qty 2

## 2013-10-04 MED ORDER — METHOCARBAMOL 500 MG PO TABS
500.0000 mg | ORAL_TABLET | Freq: Once | ORAL | Status: AC
  Administered 2013-10-04: 500 mg via ORAL
  Filled 2013-10-04: qty 1

## 2013-10-04 MED ORDER — METHOCARBAMOL 500 MG PO TABS: 500.0000 mg | ORAL_TABLET | Freq: Two times a day (BID) | ORAL | Status: DC

## 2013-10-04 MED ORDER — OXYCODONE-ACETAMINOPHEN 5-325 MG PO TABS: 2.0000 | ORAL_TABLET | ORAL | Status: DC | PRN

## 2013-10-04 MED ORDER — IBUPROFEN 800 MG PO TABS: 800.0000 mg | ORAL_TABLET | Freq: Three times a day (TID) | ORAL | Status: DC

## 2013-10-04 NOTE — ED Notes (Signed)
Pt reports today he was pulling a mattress out of the back of truck and pulled his back. States lower back pain. Denies urinary/bowel incont. But reports urinary burning. Pt is a x 4, is ambulatory. Pain is worse when he bears wt on right leg. Pt is a x 4. In NAD

## 2013-10-04 NOTE — ED Notes (Signed)
Pt c/o back pain x 2 hours.  States he injured it while trying fix a bed frame.  Pt was holding mattress up with one hand while reaching down with the other, and felt tightness in his back and eventually a pop.  Pain located lower back, slightly to the right.  Describes pain as sharp.  Rates pain 7/10.  Denies numbess/tingling in lower extremities.  Nad, resp e/u, skin warm and dry.

## 2013-10-04 NOTE — ED Provider Notes (Signed)
CSN: [id number]     Arrival date & time 10/04/13  1452 History   First MD Initiated Contact with Patient 10/04/13 1509    This chart was scribed for Domenic Moras PA-C, a non-physician practitioner working with Blanchard Kelch, MD by Denice Bors, ED Scribe. This patient was seen in room TR07C/TR07C and the patient's care was started at 3:35 PM      Chief Complaint  Patient presents with  . Back Pain     (Consider location/radiation/quality/duration/timing/severity/associated sxs/prior Treatment) The history is provided by the patient. No language interpreter was used.   HPI Comments: Kerry Wiley is a 38 y.o. male who presents to the Emergency Department complaining of constant worsening low back pain onset chronic and exacerbated this afternoon while pulling mattress felt a "pop and a pull" in low back. Describes pain as non-radiating and spasm on right low back. Reports pain is mildly alleviated when leaning toward the right side. Denies trying any medications PTA to relieve symptoms. Reports pain is exacerbated with movement and by touch. Denies urinary or fecal incontinence, urinary retention, perineal/saddle paresthesias, fever, PMHx of cancer, and IV drug use.   Past Medical History  Diagnosis Date  . GERD (gastroesophageal reflux disease)     states he has some but not "diagnosed"  . History of kidney stones    Past Surgical History  Procedure Laterality Date  . Appendectomy    . Hand surgery    . Fracture surgery    . 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 Cholecystectomy      2011  . Hardware removal  04/04/2012    Procedure: HARDWARE REMOVAL;  Surgeon: Schuyler Amor, MD;  Location: [facility];  Service: Orthopedics;  Laterality: Right;  right hand hardware removal/tenolysis   No family history on file. History  Substance Use Topics  . Smoking status: Current Every Day Smoker -- 1.00 packs/day    Types: Cigarettes  .  Smokeless tobacco: Never Used  . Alcohol Use: Yes     Comment: social drinkier    Review of Systems  Constitutional: Negative for fever.  Musculoskeletal: Positive for back pain.  Skin: Negative for wound.  Neurological: Negative for numbness.  Psychiatric/Behavioral: Negative for confusion.      Allergies  Review of patient's allergies indicates no known allergies.  Home Medications   Current Outpatient Rx  Name  Route  Sig  Dispense  Refill  . omeprazole (PRILOSEC) 20 MG capsule   Oral   Take 20 mg by mouth daily.          BP 138/78  Pulse 84  Temp(Src) 98.1 F (36.7 C) (Oral)  Resp 16  Ht 6\' 2"  (1.88 m)  Wt 240 lb (108.863 kg)  BMI 30.80 kg/m2  SpO2 99% Physical Exam  Nursing note and vitals reviewed. Constitutional: He is oriented to person, place, and time. He appears well-developed and well-nourished. No distress.  HENT:  Head: Normocephalic and atraumatic.  Eyes: EOM are normal.  Neck: Neck supple. No tracheal deviation present.  Cardiovascular: Normal rate.   Pulmonary/Chest: Effort normal. No respiratory distress.  Musculoskeletal: Normal range of motion.       Back:  Normal hip flexion and extension bilaterally   Focal point tenderness to right para-lumbar spine with spasm noted above gluteus maximus No midline C-spine, T-spine, or L-spine tenderness with no step-offs or deformities noted    Neurological: He is alert and oriented to person,  place, and time.  Skin: Skin is warm and dry.  Psychiatric: He has a normal mood and affect. His behavior is normal.    ED Course  Procedures COORDINATION OF CARE:  Nursing notes reviewed. Vital signs reviewed. Initial pt interview and examination performed.   3:42 PM-Discussed treatment plan with pt at bedside. Pt agrees with plan.  Pain likely MSK from muscle strain.  No red flags.  No radiculopathy.  Will provide sxs relief and have pt f/u with orthopedist for further care.  RICE therapy discussed.  Pt  voice understanding and agrees with plan.     Treatment plan initiated:Medications - No data to display   Initial diagnostic testing ordered.     Labs Review Labs Reviewed  URINALYSIS, ROUTINE W REFLEX MICROSCOPIC   Imaging Review No results found.   EKG Interpretation None      MDM   Final diagnoses:  Lumbosacral strain   BP 138/78  Pulse 84  Temp(Src) 98.1 F (36.7 C) (Oral)  Resp 16  Ht 6\' 2"  (1.88 m)  Wt 240 lb (108.863 kg)  BMI 30.80 kg/m2  SpO2 99%   I personally performed the services described in this documentation, which was scribed in my presence. The recorded information has been reviewed and is accurate.     Domenic Moras, PA-C 10/04/13 [phone]

## 2013-10-04 NOTE — Discharge Instructions (Signed)
Lumbosacral Strain Lumbosacral strain is a strain of any of the parts that make up your lumbosacral vertebrae. Your lumbosacral vertebrae are the bones that make up the lower third of your backbone. Your lumbosacral vertebrae are held together by muscles and tough, fibrous tissue (ligaments).  CAUSES  A sudden blow to your back can cause lumbosacral strain. Also, anything that causes an excessive stretch of the muscles in the low back can cause this strain. This is typically seen when people exert themselves strenuously, fall, lift heavy objects, bend, or crouch repeatedly. RISK FACTORS  Physically demanding work.  Participation in pushing or pulling sports or sports that require sudden twist of the back (tennis, golf, baseball).  Weight lifting.  Excessive lower back curvature.  Forward-tilted pelvis.  Weak back or abdominal muscles or both.  Tight hamstrings. SIGNS AND SYMPTOMS  Lumbosacral strain may cause pain in the area of your injury or pain that moves (radiates) down your leg.  DIAGNOSIS Your health care provider can often diagnose lumbosacral strain through a physical exam. In some cases, you may need tests such as X-ray exams.  TREATMENT  Treatment for your lower back injury depends on many factors that your clinician will have to evaluate. However, most treatment will include the use of anti-inflammatory medicines. HOME CARE INSTRUCTIONS   Avoid hard physical activities (tennis, racquetball, waterskiing) if you are not in proper physical condition for it. This may aggravate or create problems.  If you have a back problem, avoid sports requiring sudden body movements. Swimming and walking are generally safer activities.  Maintain good posture.  Maintain a healthy weight.  For acute conditions, you may put ice on the injured area.  Put ice in a plastic bag.  Place a towel between your skin and the bag.  Leave the ice on for 20 minutes, 2 3 times a day.  When the  low back starts healing, stretching and strengthening exercises may be recommended. SEEK MEDICAL CARE IF:  Your back pain is getting worse.  You experience severe back pain not relieved with medicines. SEEK IMMEDIATE MEDICAL CARE IF:   You have numbness, tingling, weakness, or problems with the use of your arms or legs.  There is a change in bowel or bladder control.  You have increasing pain in any area of the body, including your belly (abdomen).  You notice shortness of breath, dizziness, or feel faint.  You feel sick to your stomach (nauseous), are throwing up (vomiting), or become sweaty.  You notice discoloration of your toes or legs, or your feet get very cold. MAKE SURE YOU:   Understand these instructions.  Will watch your condition.  Will get help right away if you are not doing well or get worse. Document Released: 04/08/2005 Document Revised: 04/19/2013 Document Reviewed: 02/15/2013 [employer] Patient Information 2014 [employer].  

## 2013-10-05 NOTE — ED Provider Notes (Signed)
Medical screening examination/treatment/procedure(s) were performed by non-physician practitioner and as supervising physician I was immediately available for consultation/collaboration.   EKG Interpretation None        Blanchard Kelch, MD 10/05/13 [phone]

## 2013-10-09 ENCOUNTER — Emergency Department (HOSPITAL_COMMUNITY)
Admission: EM | Admit: 2013-10-09 | Discharge: 2013-10-09 | Disposition: A | Discharge status: Home / Self Care | Payer: Medicaid Other | Attending: Emergency Medicine | Admitting: Emergency Medicine

## 2013-10-09 ENCOUNTER — Encounter (HOSPITAL_COMMUNITY): Payer: Self-pay | Admitting: Emergency Medicine

## 2013-10-09 DIAGNOSIS — F17200 Nicotine dependence, unspecified, uncomplicated: Secondary | ICD-10-CM | POA: Insufficient documentation

## 2013-10-09 DIAGNOSIS — M545 Low back pain, unspecified: Secondary | ICD-10-CM | POA: Insufficient documentation

## 2013-10-09 DIAGNOSIS — Z760 Encounter for issue of repeat prescription: Secondary | ICD-10-CM | POA: Insufficient documentation

## 2013-10-09 DIAGNOSIS — K219 Gastro-esophageal reflux disease without esophagitis: Secondary | ICD-10-CM | POA: Insufficient documentation

## 2013-10-09 DIAGNOSIS — M549 Dorsalgia, unspecified: Secondary | ICD-10-CM

## 2013-10-09 DIAGNOSIS — Z791 Long term (current) use of non-steroidal anti-inflammatories (NSAID): Secondary | ICD-10-CM | POA: Insufficient documentation

## 2013-10-09 DIAGNOSIS — Z87442 Personal history of urinary calculi: Secondary | ICD-10-CM | POA: Insufficient documentation

## 2013-10-09 DIAGNOSIS — Z79899 Other long term (current) drug therapy: Secondary | ICD-10-CM | POA: Insufficient documentation

## 2013-10-09 HISTORY — DX: Calculus of gallbladder without cholecystitis without obstruction: K80.20

## 2013-10-09 MED ORDER — DIAZEPAM 5 MG PO TABS: 5.0000 mg | ORAL_TABLET | Freq: Four times a day (QID) | ORAL | Status: DC | PRN

## 2013-10-09 MED ORDER — OXYCODONE-ACETAMINOPHEN 5-325 MG PO TABS: 2.0000 | ORAL_TABLET | ORAL | Status: DC | PRN

## 2013-10-09 MED ORDER — OXYCODONE-ACETAMINOPHEN 5-325 MG PO TABS
2.0000 | ORAL_TABLET | Freq: Once | ORAL | Status: AC
  Administered 2013-10-09: 2 via ORAL
  Filled 2013-10-09: qty 2

## 2013-10-09 NOTE — ED Notes (Signed)
Pt states initially having back spasms and had medication for his spasms and was trying to go to [facility] for a referral and was unable to get an appointment for a follow up, but ran out of medications.

## 2013-10-09 NOTE — ED Provider Notes (Signed)
CSN: [id number]     Arrival date & time 10/09/13  1814 History  This chart was scribed for non-physician practitioner, Elisha Headland, FNP,working with Osvaldo Shipper, MD, by Marlowe Kays, ED Scribe.  This patient was seen in room TR08C/TR08C and the patient's care was started at 8:03 PM.  Chief Complaint  Patient presents with  . Back Pain  . Medication Refill   The history is provided by the patient. No language interpreter was used.   HPI Comments:  Kerry Wiley is a 38 y.o. male who presents to the Emergency Department complaining of severe lower back pain that started five days ago. Pt states he was seen here before and referred to [facility] but was not able to get an appt. Pt states he called the wrong orthopedic office and did not follow up further. He states he was moving his bed last week and felt a stretching and pop in the right-side of his lower back. He states he was taking Percocet and Robaxin with moderate relief. Pt reports the pain being worse in the morning after waking. He states he ran out of the Percocet two days ago. He reports taking Tylenol Arthritis with no relief. He denies numbness, tingling, bowel or bladder incontinence, or difficulty emptying his bladder.   Past Medical History  Diagnosis Date  . GERD (gastroesophageal reflux disease)     states he has some but not "diagnosed"  . History of kidney stones   . Gallstone    Past Surgical History  Procedure Laterality Date  . Appendectomy    . Hand surgery    . Fracture surgery    . 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 Cholecystectomy      2011  . Hardware removal  04/04/2012    Procedure: HARDWARE REMOVAL;  Surgeon: Schuyler Amor, MD;  Location: [facility];  Service: Orthopedics;  Laterality: Right;  right hand hardware removal/tenolysis   History reviewed. No pertinent family history. History  Substance Use Topics  . Smoking status: Current  Every Day Smoker -- 1.00 packs/day    Types: Cigarettes  . Smokeless tobacco: Never Used  . Alcohol Use: Yes     Comment: social drinkier    Review of Systems  Musculoskeletal: Positive for back pain.  All other systems reviewed and are negative.    Allergies  Review of patient's allergies indicates no known allergies.  Home Medications   Current Outpatient Rx  Name  Route  Sig  Dispense  Refill  . acetaminophen (TYLENOL) 500 MG tablet   Oral   Take 500 mg by mouth every 6 (six) hours as needed.         Marland Kitchen ibuprofen (ADVIL,MOTRIN) 800 MG tablet   Oral   Take 1 tablet (800 mg total) by mouth 3 (three) times daily.   21 tablet   0   . methocarbamol (ROBAXIN) 500 MG tablet   Oral   Take 1 tablet (500 mg total) by mouth 2 (two) times daily.   20 tablet   0   . omeprazole (PRILOSEC) 20 MG capsule   Oral   Take 20 mg by mouth daily.         Marland Kitchen oxyCODONE-acetaminophen (PERCOCET/ROXICET) 5-325 MG per tablet   Oral   Take 2 tablets by mouth every 4 (four) hours as needed for severe pain.   15 tablet   0    Triage Vitals: BP 137/88  Pulse  77  Temp(Src) 98.4 F (36.9 C) (Oral)  Resp 16  Ht 6\' 2"  (1.88 m)  Wt 244 lb (110.678 kg)  BMI 31.31 kg/m2  SpO2 99% Physical Exam  Nursing note and vitals reviewed. Constitutional: He is oriented to person, place, and time. He appears well-developed and well-nourished.  HENT:  Head: Normocephalic and atraumatic.  Eyes: EOM are normal.  Neck: Normal range of motion.  Cardiovascular: Normal rate.   Pulmonary/Chest: Effort normal.  Musculoskeletal: Normal range of motion.  Right lower back pain, paravertebral tenderness, lumbar region. Good ROM, strength and coordination. No numbness or tingling. No gait abnormality. Distal pulses 2+.  Neurological: He is alert and oriented to person, place, and time.  Skin: Skin is warm and dry.  Psychiatric: He has a normal mood and affect. His behavior is normal.    ED Course   Procedures (including critical care time) DIAGNOSTIC STUDIES: Oxygen Saturation is 99% on RA, normal by my interpretation.   COORDINATION OF CARE: 8:08 PM- Will give pain medication prior to discharge and give another referral to orthopedics. Pt verbalizes understanding and agrees to plan.  Medications - No data to display  Labs Review Labs Reviewed - No data to display Imaging Review No results found.   EKG Interpretation None      MDM   Final diagnoses:  Back pain   Patient reports that he has referral to [facility] and needs more pain medicine until he can follow up there. No numbness or tingling. No gait disturbances. No urinary symptoms or difficulty emptying bladder. Paravertebral tenderness, lumbar region. Patient is stable for discharge.   I personally performed the services described in this documentation, which was scribed in my presence. The recorded information has been reviewed and is accurate.    Elisha Headland, NP 10/10/13 [phone]

## 2013-10-09 NOTE — Discharge Instructions (Signed)
Lumbosacral Strain Lumbosacral strain is a strain of any of the parts that make up your lumbosacral vertebrae. Your lumbosacral vertebrae are the bones that make up the lower third of your backbone. Your lumbosacral vertebrae are held together by muscles and tough, fibrous tissue (ligaments).  CAUSES  A sudden blow to your back can cause lumbosacral strain. Also, anything that causes an excessive stretch of the muscles in the low back can cause this strain. This is typically seen when people exert themselves strenuously, fall, lift heavy objects, bend, or crouch repeatedly. RISK FACTORS  Physically demanding work.  Participation in pushing or pulling sports or sports that require sudden twist of the back (tennis, golf, baseball).  Weight lifting.  Excessive lower back curvature.  Forward-tilted pelvis.  Weak back or abdominal muscles or both.  Tight hamstrings. SIGNS AND SYMPTOMS  Lumbosacral strain may cause pain in the area of your injury or pain that moves (radiates) down your leg.  DIAGNOSIS Your health care provider can often diagnose lumbosacral strain through a physical exam. In some cases, you may need tests such as X-ray exams.  TREATMENT  Treatment for your lower back injury depends on many factors that your clinician will have to evaluate. However, most treatment will include the use of anti-inflammatory medicines. HOME CARE INSTRUCTIONS   Avoid hard physical activities (tennis, racquetball, waterskiing) if you are not in proper physical condition for it. This may aggravate or create problems.  If you have a back problem, avoid sports requiring sudden body movements. Swimming and walking are generally safer activities.  Maintain good posture.  Maintain a healthy weight.  For acute conditions, you may put ice on the injured area.  Put ice in a plastic bag.  Place a towel between your skin and the bag.  Leave the ice on for 20 minutes, 2 3 times a day.  When the  low back starts healing, stretching and strengthening exercises may be recommended. SEEK MEDICAL CARE IF:  Your back pain is getting worse.  You experience severe back pain not relieved with medicines. SEEK IMMEDIATE MEDICAL CARE IF:   You have numbness, tingling, weakness, or problems with the use of your arms or legs.  There is a change in bowel or bladder control.  You have increasing pain in any area of the body, including your belly (abdomen).  You notice shortness of breath, dizziness, or feel faint.  You feel sick to your stomach (nauseous), are throwing up (vomiting), or become sweaty.  You notice discoloration of your toes or legs, or your feet get very cold. MAKE SURE YOU:   Understand these instructions.  Will watch your condition.  Will get help right away if you are not doing well or get worse. Document Released: 04/08/2005 Document Revised: 04/19/2013 Document Reviewed: 02/15/2013 [facility] Patient Information 2014 [place], [place].   Take meds as prescribed Follow up with Ortho

## 2013-10-10 NOTE — ED Provider Notes (Signed)
Medical screening examination/treatment/procedure(s) were performed by non-physician practitioner and as supervising physician I was immediately available for consultation/collaboration.   EKG Interpretation None        Osvaldo Shipper, MD 10/10/13 2324

## 2013-12-18 ENCOUNTER — Encounter (HOSPITAL_COMMUNITY): Payer: Self-pay | Admitting: Emergency Medicine

## 2013-12-18 ENCOUNTER — Emergency Department (HOSPITAL_COMMUNITY)
Admission: EM | Admit: 2013-12-18 | Discharge: 2013-12-18 | Disposition: A | Discharge status: Home / Self Care | Payer: Medicaid Other | Attending: Emergency Medicine | Admitting: Emergency Medicine

## 2013-12-18 DIAGNOSIS — Z791 Long term (current) use of non-steroidal anti-inflammatories (NSAID): Secondary | ICD-10-CM | POA: Insufficient documentation

## 2013-12-18 DIAGNOSIS — M545 Low back pain, unspecified: Secondary | ICD-10-CM | POA: Insufficient documentation

## 2013-12-18 DIAGNOSIS — Z8719 Personal history of other diseases of the digestive system: Secondary | ICD-10-CM | POA: Insufficient documentation

## 2013-12-18 DIAGNOSIS — F17200 Nicotine dependence, unspecified, uncomplicated: Secondary | ICD-10-CM | POA: Insufficient documentation

## 2013-12-18 DIAGNOSIS — Z79899 Other long term (current) drug therapy: Secondary | ICD-10-CM | POA: Insufficient documentation

## 2013-12-18 DIAGNOSIS — Z87442 Personal history of urinary calculi: Secondary | ICD-10-CM | POA: Insufficient documentation

## 2013-12-18 MED ORDER — DIAZEPAM 5 MG PO TABS
5.0000 mg | ORAL_TABLET | Freq: Once | ORAL | Status: AC
  Administered 2013-12-18: 5 mg via ORAL
  Filled 2013-12-18: qty 1

## 2013-12-18 MED ORDER — KETOROLAC TROMETHAMINE 60 MG/2ML IM SOLN
60.0000 mg | Freq: Once | INTRAMUSCULAR | Status: AC
  Administered 2013-12-18: 60 mg via INTRAMUSCULAR
  Filled 2013-12-18: qty 2

## 2013-12-18 MED ORDER — OXYCODONE-ACETAMINOPHEN 5-325 MG PO TABS
2.0000 | ORAL_TABLET | Freq: Once | ORAL | Status: AC
  Administered 2013-12-18: 2 via ORAL
  Filled 2013-12-18: qty 2

## 2013-12-18 MED ORDER — CYCLOBENZAPRINE HCL 10 MG PO TABS: 10.0000 mg | ORAL_TABLET | Freq: Three times a day (TID) | ORAL | Status: DC | PRN

## 2013-12-18 MED ORDER — NAPROXEN 500 MG PO TABS: 500.0000 mg | ORAL_TABLET | Freq: Two times a day (BID) | ORAL | Status: DC

## 2013-12-18 NOTE — ED Notes (Addendum)
Ongoing lower back pain for some time. Pt does construction work. Pt states he started hurting again on Friday evening. Has taken OTC meds with no relief. Pt denies injury.

## 2013-12-18 NOTE — Discharge Instructions (Signed)
Back Exercises Back exercises help treat and prevent back injuries. The goal of back exercises is to increase the strength of your abdominal and back muscles and the flexibility of your back. These exercises should be started when you no longer have back pain. Back exercises include:  Pelvic Tilt. Lie on your back with your knees bent. Tilt your pelvis until the lower part of your back is against the floor. Hold this position 5 to 10 sec and repeat 5 to 10 times.  Knee to Chest. Pull first 1 knee up against your chest and hold for 20 to 30 seconds, repeat this with the other knee, and then both knees. This may be done with the other leg straight or bent, whichever feels better.  Sit-Ups or Curl-Ups. Bend your knees 90 degrees. Start with tilting your pelvis, and do a partial, slow sit-up, lifting your trunk only 30 to 45 degrees off the floor. Take at least 2 to 3 seconds for each sit-up. Do not do sit-ups with your knees out straight. If partial sit-ups are difficult, simply do the above but with only tightening your abdominal muscles and holding it as directed.  Hip-Lift. Lie on your back with your knees flexed 90 degrees. Push down with your feet and shoulders as you raise your hips a couple inches off the floor; hold for 10 seconds, repeat 5 to 10 times.  Back arches. Lie on your stomach, propping yourself up on bent elbows. Slowly press on your hands, causing an arch in your low back. Repeat 3 to 5 times. Any initial stiffness and discomfort should lessen with repetition over time.  Shoulder-Lifts. Lie face down with arms beside your body. Keep hips and torso pressed to floor as you slowly lift your head and shoulders off the floor. Do not overdo your exercises, especially in the beginning. Exercises may cause you some mild back discomfort which lasts for a few minutes; however, if the pain is more severe, or lasts for more than 15 minutes, do not continue exercises until you see your caregiver.  Improvement with exercise therapy for back problems is slow.  See your caregivers for assistance with developing a proper back exercise program. Document Released: 08/06/2004 Document Revised: 09/21/2011 Document Reviewed: 04/30/2011 [employer] Patient Information 2014 [employer].  

## 2013-12-19 NOTE — ED Provider Notes (Signed)
CSN: [id number]     Arrival date & time 12/18/13  3875 History   First MD Initiated Contact with Patient 12/18/13 [phone]     Chief Complaint  Patient presents with  . Back Pain     HPI Patient reports his had some ongoing low back pain over the past 4-6 months.  He states his back pain waxes and wanes.  He felt as though his back pain was doing better and so he returned to work doing [employer].  He now reports after doing some lifting yesterday worsening low back pain.  He denies numbness of his arms or legs.  No weakness in his legs.  No bowel or bladder complaints.  No abdominal pain.  No fevers or chills.  No IV drug abuse.  He's tried ibuprofen without improvement in his symptoms.  Pain is mild to moderate in severity.  Past Medical History  Diagnosis Date  . GERD (gastroesophageal reflux disease)     states he has some but not "diagnosed"  . History of kidney stones   . Gallstone    Past Surgical History  Procedure Laterality Date  . Appendectomy    . Hand surgery    . Fracture surgery    . 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 Cholecystectomy      2011  . Hardware removal  04/04/2012    Procedure: HARDWARE REMOVAL;  Surgeon: Schuyler Amor, MD;  Location: [facility];  Service: Orthopedics;  Laterality: Right;  right hand hardware removal/tenolysis   No family history on file. History  Substance Use Topics  . Smoking status: Current Every Day Smoker -- 1.00 packs/day    Types: Cigarettes  . Smokeless tobacco: Never Used  . Alcohol Use: Yes     Comment: social drinkier    Review of Systems  All other systems reviewed and are negative.     Allergies  Review of patient's allergies indicates no known allergies.  Home Medications   Prior to Admission medications   Medication Sig Start Date End Date Taking? Authorizing Provider  cyclobenzaprine (FLEXERIL) 10 MG tablet Take 1 tablet (10 mg total) by mouth 3 (three) times  daily as needed for muscle spasms. 12/18/13   Hoy Morn, MD  naproxen (NAPROSYN) 500 MG tablet Take 1 tablet (500 mg total) by mouth 2 (two) times daily. 12/18/13   Hoy Morn, MD   BP 146/100  Pulse 65  Temp(Src) 98.4 F (36.9 C) (Oral)  Resp 19  Ht 6\' 2"  (1.88 m)  Wt 245 lb (111.131 kg)  BMI 31.44 kg/m2  SpO2 100% Physical Exam  Nursing note and vitals reviewed. Constitutional: He is oriented to person, place, and time. He appears well-developed and well-nourished.  HENT:  Head: Normocephalic and atraumatic.  Eyes: EOM are normal.  Neck: Normal range of motion.  Cardiovascular: Normal rate.   Pulmonary/Chest: Effort normal and breath sounds normal.  Abdominal: Soft. He exhibits no distension. There is no tenderness.  Musculoskeletal: Normal range of motion.  Mild paralumbar tenderness and spasm.  No midline point tenderness.  No overlying skin changes.  Neurological: He is alert and oriented to person, place, and time.  Skin: Skin is warm and dry.  Psychiatric: He has a normal mood and affect. Judgment normal.    ED Course  Procedures (including critical care time) Labs Review Labs Reviewed - No data to display  Imaging Review No results found.   EKG  Interpretation None      MDM   Final diagnoses:  Low back pain    Normal lower extremity neurologic exam. No bowel or bladder complaints. No back pain red flags. Likely musculoskeletal back pain. Doubt spinal epidural abscess. Doubt cauda equina. Doubt abdominal aortic aneurysm     Hoy Morn, MD 12/19/13 [phone]

## 2014-02-28 ENCOUNTER — Encounter (HOSPITAL_COMMUNITY): Payer: Self-pay | Admitting: Emergency Medicine

## 2014-02-28 ENCOUNTER — Emergency Department (HOSPITAL_COMMUNITY)
Admission: EM | Admit: 2014-02-28 | Discharge: 2014-02-28 | Disposition: A | Discharge status: Home / Self Care | Payer: Medicaid Other | Attending: Emergency Medicine | Admitting: Emergency Medicine

## 2014-02-28 DIAGNOSIS — G8929 Other chronic pain: Secondary | ICD-10-CM

## 2014-02-28 DIAGNOSIS — M545 Low back pain, unspecified: Secondary | ICD-10-CM

## 2014-02-28 DIAGNOSIS — R52 Pain, unspecified: Secondary | ICD-10-CM | POA: Insufficient documentation

## 2014-02-28 DIAGNOSIS — R5383 Other fatigue: Secondary | ICD-10-CM

## 2014-02-28 DIAGNOSIS — Z87442 Personal history of urinary calculi: Secondary | ICD-10-CM | POA: Insufficient documentation

## 2014-02-28 DIAGNOSIS — R5381 Other malaise: Secondary | ICD-10-CM | POA: Insufficient documentation

## 2014-02-28 DIAGNOSIS — Z8719 Personal history of other diseases of the digestive system: Secondary | ICD-10-CM | POA: Insufficient documentation

## 2014-02-28 DIAGNOSIS — F17200 Nicotine dependence, unspecified, uncomplicated: Secondary | ICD-10-CM | POA: Insufficient documentation

## 2014-02-28 MED ORDER — OXYCODONE-ACETAMINOPHEN 5-325 MG PO TABS
1.0000 | ORAL_TABLET | Freq: Once | ORAL | Status: AC
  Administered 2014-02-28: 1 via ORAL
  Filled 2014-02-28: qty 1

## 2014-02-28 MED ORDER — DEXAMETHASONE SODIUM PHOSPHATE 10 MG/ML IJ SOLN: 10.0000 mg | Freq: Once | INTRAMUSCULAR | Status: DC

## 2014-02-28 MED ORDER — METHYLPREDNISOLONE SODIUM SUCC 125 MG IJ SOLR
125.0000 mg | Freq: Once | INTRAMUSCULAR | Status: AC
  Administered 2014-02-28: 125 mg via INTRAMUSCULAR
  Filled 2014-02-28: qty 2

## 2014-02-28 MED ORDER — OXYCODONE-ACETAMINOPHEN 5-325 MG PO TABS: ORAL_TABLET | ORAL | Status: DC

## 2014-02-28 MED ORDER — METHOCARBAMOL 500 MG PO TABS: 1000.0000 mg | ORAL_TABLET | Freq: Four times a day (QID) | ORAL | Status: DC | PRN

## 2014-02-28 MED ORDER — DIAZEPAM 5 MG PO TABS
5.0000 mg | ORAL_TABLET | Freq: Once | ORAL | Status: AC
  Administered 2014-02-28: 5 mg via ORAL
  Filled 2014-02-28: qty 1

## 2014-02-28 MED ORDER — PREDNISONE 20 MG PO TABS: ORAL_TABLET | ORAL | Status: DC

## 2014-02-28 NOTE — ED Notes (Signed)
Declined W/C at D/C and was escorted to lobby by RN. 

## 2014-02-28 NOTE — ED Notes (Signed)
Pt presents to department for evaluation of back pain. States chronic issues with same, reports pain became worse yesterday, states he works for [employer]. 8/10 pain at the time. No obvious deformities noted. Denies recent injury. NAD.

## 2014-02-28 NOTE — ED Provider Notes (Signed)
CSN: [id number]     Arrival date & time 02/28/14  1326 History  This chart was scribed for [employer], PA-C, working with [employer]. Alvino Chapel, MD by Steva Colder, ED Scribe. The patient was seen in room TR07C/TR07C at 2:46 PM.     Chief Complaint  Patient presents with  . Back Pain      The history is provided by the patient. No language interpreter was used.   HPI Comments: Kerry Wiley is a 38 y.o. male who presents to the Emergency Department complaining of back pain onset yesterday. He states that he has chronic issues. He states that his pain became worse yesterday. he rates his pain as 9/10. He states that this morning when he woke up the pain was worse. He states that he has more pain on the right side. He states that his left leg has a tingling sensation.   He states that he is having associated symptoms of left leg weakness. He denies numbness, fever, and any other associated symptoms. He denies CA or being DM. He denies being an injected drug user. He states that he does not take any pain medications at home. He denies any recent injury. He states that he works for a [employer]. He states that he sees AVBUERE,EDWIN A, MD for his back pain issues. He denies being allergic to any medications.  Past Medical History  Diagnosis Date  . GERD (gastroesophageal reflux disease)     states he has some but not "diagnosed"  . History of kidney stones   . Gallstone    Past Surgical History  Procedure Laterality Date  . Appendectomy    . Hand surgery    . Fracture surgery    . 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 Cholecystectomy      2011  . Hardware removal  04/04/2012    Procedure: HARDWARE REMOVAL;  Surgeon: Schuyler Amor, MD;  Location: [facility];  Service: Orthopedics;  Laterality: Right;  right hand hardware removal/tenolysis   History reviewed. No pertinent family history. History  Substance  Use Topics  . Smoking status: Current Every Day Smoker -- 1.00 packs/day    Types: Cigarettes  . Smokeless tobacco: Never Used  . Alcohol Use: Yes     Comment: social drinkier    Review of Systems  Musculoskeletal: Positive for back pain.   A complete 10 system review of systems was obtained and all systems are negative except as noted in the HPI and PMH.     Allergies  Review of patient's allergies indicates no known allergies.  Home Medications   Prior to Admission medications   Medication Sig Start Date End Date Taking? Authorizing Provider  methocarbamol (ROBAXIN) 500 MG tablet Take 2 tablets (1,000 mg total) by mouth 4 (four) times daily as needed (Pain). 02/28/14   Jontae Sonier, PA-C  oxyCODONE-acetaminophen (PERCOCET/ROXICET) 5-325 MG per tablet 1 to 2 tabs PO q6hrs  PRN for pain 02/28/14   Elmyra Ricks Fantashia Shupert, PA-C  predniSONE (DELTASONE) 20 MG tablet 3 tabs po daily x 3 days, then 2 tabs x 3 days, then 1.5 tabs x 3 days, then 1 tab x 3 days, then 0.5 tabs x 3 days 02/28/14   Elmyra Ricks Adrianna Dudas, PA-C   BP 159/92  Temp(Src) 98.8 F (37.1 C)  Resp 18  SpO2 97%  Physical Exam  Nursing note and vitals reviewed. Constitutional: He appears well-developed and well-nourished.  HENT:  Head: Normocephalic.  Eyes: Conjunctivae are normal.  Neck: Normal range of motion.  Cardiovascular: Normal rate, regular rhythm and intact distal pulses.   Pulmonary/Chest: Effort normal.  Abdominal: Soft. There is no tenderness.  Neurological: He is alert.  No point tenderness to percussion of lumbar spinal processes.  No TTP or paraspinal muscular spasm. Strength is 5 out of 5 to bilateral lower extremities at hip and knee; extensor hallucis longus 5 out of 5. Ankle strength 5 out of 5, no clonus, neurovascularly intact. No saddle anaesthesia. Patellar reflexes are 2+ bilaterally.     Psychiatric: He has a normal mood and affect.    ED Course  Procedures (including critical care  time) DIAGNOSTIC STUDIES: Oxygen Saturation is 97% on room air, normal by my interpretation.    COORDINATION OF CARE: 2:50 PM-Discussed treatment plan which includes Percocet, Solu-medrol, and Valium with pt at bedside and pt agreed to plan.   Labs Review Labs Reviewed - No data to display  Imaging Review No results found.   EKG Interpretation None      MDM   Final diagnoses:  Acute exacerbation of chronic low back pain    Filed Vitals:   02/28/14 1344 02/28/14 1527  BP: 159/92 136/99  Pulse:  69  Temp: 98.8 F (37.1 C)   TempSrc:  Oral  Resp: 18 12  SpO2: 97% 99%    Medications  diazepam (VALIUM) tablet 5 mg (5 mg Oral Given 02/28/14 1520)  oxyCODONE-acetaminophen (PERCOCET/ROXICET) 5-325 MG per tablet 1 tablet (1 tablet Oral Given 02/28/14 1520)  methylPREDNISolone sodium succinate (SOLU-MEDROL) 125 mg/2 mL injection 125 mg (125 mg Intramuscular Given 02/28/14 1520)    Kerry Wiley is a 38 y.o. male presenting with  back pain.  No neurological deficits and normal neuro exam.  Patient can walk but states is painful.  No loss of bowel or bladder control.  No concern for cauda equina.  No fever, night sweats, weight loss, h/o cancer, IVDU.  RICE protocol and pain medicine indicated and discussed with patient.  Evaluation does not show pathology that would require ongoing emergent intervention or inpatient treatment. Pt is hemodynamically stable and mentating appropriately. Discussed findings and plan with patient/guardian, who agrees with care plan. All questions answered. Return precautions discussed and outpatient follow up given.   Discharge Medication List as of 02/28/2014  3:17 PM    START taking these medications   Details  methocarbamol (ROBAXIN) 500 MG tablet Take 2 tablets (1,000 mg total) by mouth 4 (four) times daily as needed (Pain)., Starting 02/28/2014, Until Discontinued, Print    oxyCODONE-acetaminophen (PERCOCET/ROXICET) 5-325 MG per tablet 1 to 2 tabs  PO q6hrs  PRN for pain, Print           I personally performed the services described in this documentation, which was scribed in my presence. The recorded information has been reviewed and is accurate.    Monico Blitz, PA-C 02/28/14 1709

## 2014-02-28 NOTE — Discharge Instructions (Signed)
For pain control you may take up to 800mg  of Motrin (also known as ibuprofen). That is usually 4 over the counter pills,  3 times a day. Take with food to minimize stomach irritation   You can also take  tylenol (acetaminophen) 975mg  (this is 3 over the counter pills) four times a day. Do not drink alcohol or combine with other medications that have acetaminophen as an ingredient (Read the labels!).    For breakthrough pain you may take Robaxin. Do not drink alcohol, drive or operate heavy machinery when taking Robaxin.  Take percocet for breakthrough pain, do not drink alcohol, drive, care for children or do other critical tasks while taking percocet.  Please follow with your primary care doctor in the next 2 days for a check-up. They must obtain records for further management.   Do not hesitate to return to the Emergency Department for any new, worsening or concerning symptoms.

## 2014-02-28 NOTE — ED Notes (Signed)
PT AND FAMILY GIVEN HAPPY MEALS.

## 2014-02-28 NOTE — ED Notes (Signed)
PT reported pain at injection site . Pt given an ice pack to apply to LT deltoid.

## 2014-02-28 NOTE — ED Notes (Signed)
oN OBSERVATION SKIN INTACT NO SWELLING OR REDNESS SEEN. PT WAS D/C  WITH ICE PACK.

## 2014-03-01 NOTE — ED Provider Notes (Signed)
Medical screening examination/treatment/procedure(s) were performed by non-physician practitioner and as supervising physician I was immediately available for consultation/collaboration.   EKG Interpretation None       Jasper Riling. Alvino Chapel, MD 03/01/14 [phone]

## 2014-03-26 ENCOUNTER — Emergency Department (HOSPITAL_COMMUNITY)
Admission: EM | Admit: 2014-03-26 | Discharge: 2014-03-26 | Disposition: A | Discharge status: Home / Self Care | Payer: Medicaid Other | Attending: Emergency Medicine | Admitting: Emergency Medicine

## 2014-03-26 ENCOUNTER — Encounter (HOSPITAL_COMMUNITY): Payer: Self-pay | Admitting: Emergency Medicine

## 2014-03-26 DIAGNOSIS — G8929 Other chronic pain: Secondary | ICD-10-CM | POA: Insufficient documentation

## 2014-03-26 DIAGNOSIS — Z87442 Personal history of urinary calculi: Secondary | ICD-10-CM | POA: Insufficient documentation

## 2014-03-26 DIAGNOSIS — M545 Low back pain, unspecified: Secondary | ICD-10-CM | POA: Insufficient documentation

## 2014-03-26 DIAGNOSIS — M5430 Sciatica, unspecified side: Secondary | ICD-10-CM | POA: Insufficient documentation

## 2014-03-26 DIAGNOSIS — F17200 Nicotine dependence, unspecified, uncomplicated: Secondary | ICD-10-CM | POA: Insufficient documentation

## 2014-03-26 DIAGNOSIS — Z8719 Personal history of other diseases of the digestive system: Secondary | ICD-10-CM | POA: Insufficient documentation

## 2014-03-26 MED ORDER — HYDROCODONE-ACETAMINOPHEN 5-325 MG PO TABS
2.0000 | ORAL_TABLET | Freq: Once | ORAL | Status: AC
  Administered 2014-03-26: 2 via ORAL
  Filled 2014-03-26: qty 2

## 2014-03-26 MED ORDER — DIAZEPAM 5 MG PO TABS
10.0000 mg | ORAL_TABLET | Freq: Once | ORAL | Status: AC
  Administered 2014-03-26: 10 mg via ORAL
  Filled 2014-03-26: qty 2

## 2014-03-26 MED ORDER — DIAZEPAM 5 MG PO TABS: 5.0000 mg | ORAL_TABLET | Freq: Two times a day (BID) | ORAL | Status: DC

## 2014-03-26 MED ORDER — HYDROCODONE-ACETAMINOPHEN 5-325 MG PO TABS: 1.0000 | ORAL_TABLET | Freq: Four times a day (QID) | ORAL | Status: DC | PRN

## 2014-03-26 MED ORDER — IBUPROFEN 800 MG PO TABS
800.0000 mg | ORAL_TABLET | Freq: Once | ORAL | Status: AC
  Administered 2014-03-26: 800 mg via ORAL
  Filled 2014-03-26: qty 1

## 2014-03-26 NOTE — ED Notes (Signed)
Pt reports lower back pain that has been going on for months. Pt was seen here for previously for same, was sent home with prescriptions but did not follow up because his back felt better. Pt states last night his niece jumped on his back and pain has progressively gotten worse. Pt reports weakness in legs, unable to bear weight, pain is worse on left side. Pt is tearful rates pain 10\10.

## 2014-03-26 NOTE — ED Provider Notes (Signed)
CSN: [id number]     Arrival date & time 03/26/14  7035 History   First MD Initiated Contact with Patient 03/26/14 [phone]     Chief Complaint  Patient presents with  . Back Pain   (Consider location/radiation/quality/duration/timing/severity/associated sxs/prior Treatment) HPI Kerry Wiley is a 38 yo male with presenting with low back pain x 1 day.  Pt states the pain started last night around 8 pm while he was driving.  He recalls his 47 yr old niece jumping on his back when he was laying on the couch, but denies any pain at that time.  He notes some pain later while he was driving, but severe, 10/10 pain when he got out of the car.  He was uncomfortable while he was sleeping, but had a difficult time getting out of bed because of pain, and had to hold on to the bedside table to stand.  He reports the pain as being in the lowest point of his back just above his buttocks and radiates to both legs but the left leg is the worst.  He denies IVDU, incontinence, saddle parasthesia, or altered sensation in either leg, chest pain or shortness of breath.   Past Medical History  Diagnosis Date  . GERD (gastroesophageal reflux disease)     states he has some but not "diagnosed"  . History of kidney stones   . Gallstone    Past Surgical History  Procedure Laterality Date  . Appendectomy    . Hand surgery    . Fracture surgery    . 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 Cholecystectomy      2011  . Hardware removal  04/04/2012    Procedure: HARDWARE REMOVAL;  Surgeon: Schuyler Amor, MD;  Location: [facility];  Service: Orthopedics;  Laterality: Right;  right hand hardware removal/tenolysis   History reviewed. No pertinent family history. History  Substance Use Topics  . Smoking status: Current Every Day Smoker -- 1.00 packs/day    Types: Cigarettes  . Smokeless tobacco: Never Used  . Alcohol Use: Yes     Comment: social drinkier    Review of Systems    Constitutional: Negative for fever and chills.  HENT: Negative for sore throat.   Eyes: Negative for visual disturbance.  Respiratory: Negative for cough and shortness of breath.   Cardiovascular: Negative for chest pain and leg swelling.  Gastrointestinal: Negative for nausea, vomiting and diarrhea.  Genitourinary: Negative for dysuria.  Musculoskeletal: Positive for back pain and gait problem. Negative for myalgias.  Skin: Negative for rash.  Neurological: Negative for weakness, numbness and headaches.   Allergies  Review of patient's allergies indicates no known allergies.  Home Medications   Prior to Admission medications   Medication Sig Start Date End Date Taking? Authorizing Provider  methocarbamol (ROBAXIN) 500 MG tablet Take 500 mg by mouth every 6 (six) hours as needed for muscle spasms.   Yes Historical Provider, MD   BP 146/78  Pulse 84  Temp(Src) 98.3 F (36.8 C) (Oral)  Resp 22  SpO2 98% Physical Exam  Nursing note and vitals reviewed. Constitutional: He is oriented to person, place, and time. He appears well-developed and well-nourished. No distress.  Pt is tearful during exam and appears uncomfortable  HENT:  Head: Normocephalic and atraumatic.  Mouth/Throat: Oropharynx is clear and moist. No oropharyngeal exudate.  Eyes: Conjunctivae are normal.  Neck: Neck supple. No thyromegaly present.  Cardiovascular: Normal rate, regular rhythm  and intact distal pulses.   Pulmonary/Chest: Effort normal and breath sounds normal. No respiratory distress. He has no wheezes. He has no rales. He exhibits no tenderness.  Abdominal: Soft. There is no tenderness.  Musculoskeletal: He exhibits tenderness.       Lumbar back: He exhibits decreased range of motion ( due to pain) and tenderness. He exhibits no bony tenderness.       Back:  5/5 strength straight leg raise but can only raise 30 degrees due to pain  Lymphadenopathy:    He has no cervical adenopathy.  Neurological:  He is alert and oriented to person, place, and time. He has normal strength. No cranial nerve deficit or sensory deficit. Coordination normal. GCS eye subscore is 4. GCS verbal subscore is 5. GCS motor subscore is 6.  Reflex Scores:      Patellar reflexes are 2+ on the right side and 2+ on the left side. 5/5 dorsiflexion, plantarflexion. Gait difficult due to pain, not weakness  Skin: Skin is warm and dry. No rash noted. He is not diaphoretic.  Psychiatric: He has a normal mood and affect.    ED Course  Procedures (including critical care time) Labs Review Labs Reviewed - No data to display  Imaging Review No results found.   EKG Interpretation None      MDM   Final diagnoses:  Midline low back pain, with sciatica presence unspecified   38 yo male with chronic low back pain but new onset of midline low back pain.  No neurological deficits and normal neuro exam.  No indication of cauda equina. No bowel or bladder incontinence, fever, night sweats, weight loss, or IVDU.  Pain improved with meds.  Pt able to ambulate.  Discharge instructions include prescriptions for meds given in the ED and follow up with ortho referral.  Pt aware of plan and in agreement.  Return precautions provided.  Filed Vitals:   03/26/14 0641 03/26/14 0700 03/26/14 0921  BP: 136/77 146/78 149/85  Pulse: 87 84 85  Temp: 98.3 F (36.8 C)  98.8 F (37.1 C)  TempSrc: Oral  Oral  Resp: 22  16  SpO2: 99% 98%    Meds given in ED:  Medications  HYDROcodone-acetaminophen (NORCO/VICODIN) 5-325 MG per tablet 2 tablet (2 tablets Oral Given 03/26/14 0756)  ibuprofen (ADVIL,MOTRIN) tablet 800 mg (800 mg Oral Given 03/26/14 0756)  diazepam (VALIUM) tablet 10 mg (10 mg Oral Given 03/26/14 0756)    Discharge Medication List as of 03/26/2014  8:50 AM    START taking these medications   Details  diazepam (VALIUM) 5 MG tablet Take 1 tablet (5 mg total) by mouth 2 (two) times daily., Starting 03/26/2014, Until  Discontinued, Print    HYDROcodone-acetaminophen (NORCO/VICODIN) 5-325 MG per tablet Take 1-2 tablets by mouth every 6 (six) hours as needed for moderate pain or severe pain., Starting 03/26/2014, Until Discontinued, Print              Britt Bottom, NP 03/28/14 [phone]

## 2014-03-26 NOTE — Discharge Instructions (Signed)
Please follow the directions provided.  Be sure to follow up with your orthopedic doctor regarding this back pain.  You may take ibuprofen 600 mg by mouth every 6-8 hours for mild to moderate pain, it may be taken with the vicodin and valium for more severe pain.  Do not drive when taking the vicodin or valium.    SEEK IMMEDIATE MEDICAL ATTENTION IF: New numbness, tingling, weakness, or problem with the use of your arms or legs.  Severe back pain not relieved with medications.  Change in bowel or bladder control.  Increasing pain in any areas of the body (such as chest or abdominal pain).  Shortness of breath, dizziness or fainting.  Nausea (feeling sick to your stomach), vomiting, fever, or sweats.

## 2014-03-29 NOTE — ED Provider Notes (Signed)
  This was a shared visit with a mid-level provided (NP or PA).  Throughout the patient's course I was available for consultation/collaboration.  I saw the ECG (if appropriate), relevant labs and studies - I agree with the interpretation.  On my exam the patient was in no distress.  He was moving all extremities spontaneously, with no true incontinence, nor any fever, chills suggestive of either neurologic or infectious etiology. Patient started on a course of therapy for his likely musculoskeletal back pain, discharged in stable condition.      Carmin Muskrat, MD 03/29/14 [phone]

## 2014-06-25 ENCOUNTER — Encounter (HOSPITAL_COMMUNITY): Payer: Self-pay | Admitting: *Deleted

## 2014-06-25 ENCOUNTER — Emergency Department (HOSPITAL_COMMUNITY)
Admission: EM | Admit: 2014-06-25 | Discharge: 2014-06-25 | Disposition: A | Discharge status: Home / Self Care | Payer: Medicaid Other | Attending: Emergency Medicine | Admitting: Emergency Medicine

## 2014-06-25 ENCOUNTER — Observation Stay (HOSPITAL_COMMUNITY)
Admission: AD | Admit: 2014-06-25 | Discharge: 2014-06-26 | Disposition: A | Discharge status: Home / Self Care | Payer: Medicaid Other | Source: Intra-hospital | Attending: Psychiatry | Admitting: Psychiatry

## 2014-06-25 ENCOUNTER — Encounter (HOSPITAL_COMMUNITY): Payer: Self-pay

## 2014-06-25 ENCOUNTER — Emergency Department (HOSPITAL_COMMUNITY): Payer: Medicaid Other

## 2014-06-25 DIAGNOSIS — F1410 Cocaine abuse, uncomplicated: Secondary | ICD-10-CM | POA: Insufficient documentation

## 2014-06-25 DIAGNOSIS — Z8719 Personal history of other diseases of the digestive system: Secondary | ICD-10-CM | POA: Diagnosis not present

## 2014-06-25 DIAGNOSIS — R45851 Suicidal ideations: Secondary | ICD-10-CM | POA: Diagnosis present

## 2014-06-25 DIAGNOSIS — Z720 Tobacco use: Secondary | ICD-10-CM | POA: Insufficient documentation

## 2014-06-25 DIAGNOSIS — Z79899 Other long term (current) drug therapy: Secondary | ICD-10-CM | POA: Insufficient documentation

## 2014-06-25 DIAGNOSIS — F4323 Adjustment disorder with mixed anxiety and depressed mood: Principal | ICD-10-CM | POA: Insufficient documentation

## 2014-06-25 DIAGNOSIS — F17210 Nicotine dependence, cigarettes, uncomplicated: Secondary | ICD-10-CM | POA: Diagnosis not present

## 2014-06-25 DIAGNOSIS — Z87442 Personal history of urinary calculi: Secondary | ICD-10-CM | POA: Insufficient documentation

## 2014-06-25 DIAGNOSIS — F1994 Other psychoactive substance use, unspecified with psychoactive substance-induced mood disorder: Secondary | ICD-10-CM

## 2014-06-25 DIAGNOSIS — R079 Chest pain, unspecified: Secondary | ICD-10-CM | POA: Insufficient documentation

## 2014-06-25 DIAGNOSIS — F14988 Cocaine use, unspecified with other cocaine-induced disorder: Secondary | ICD-10-CM

## 2014-06-25 LAB — CBC WITH DIFFERENTIAL/PLATELET
Basophils Absolute: 0 10*3/uL (ref 0.0–0.1)
Basophils Relative: 1 % (ref 0–1)
Eosinophils Absolute: 0.1 10*3/uL (ref 0.0–0.7)
Eosinophils Relative: 1 % (ref 0–5)
HCT: 41.5 % (ref 39.0–52.0)
Hemoglobin: 14.1 g/dL (ref 13.0–17.0)
LYMPHS ABS: 2.2 10*3/uL (ref 0.7–4.0)
Lymphocytes Relative: 34 % (ref 12–46)
MCH: 28.3 pg (ref 26.0–34.0)
MCHC: 34 g/dL (ref 30.0–36.0)
MCV: 83.2 fL (ref 78.0–100.0)
Monocytes Absolute: 0.8 10*3/uL (ref 0.1–1.0)
Monocytes Relative: 12 % (ref 3–12)
NEUTROS ABS: 3.4 10*3/uL (ref 1.7–7.7)
NEUTROS PCT: 52 % (ref 43–77)
Platelets: 209 10*3/uL (ref 150–400)
RBC: 4.99 MIL/uL (ref 4.22–5.81)
RDW: 14.7 % (ref 11.5–15.5)
WBC: 6.5 10*3/uL (ref 4.0–10.5)

## 2014-06-25 LAB — RAPID URINE DRUG SCREEN, HOSP PERFORMED
AMPHETAMINES: NOT DETECTED
BARBITURATES: NOT DETECTED
Benzodiazepines: NOT DETECTED
Cocaine: POSITIVE — AB
Opiates: NOT DETECTED
Tetrahydrocannabinol: NOT DETECTED

## 2014-06-25 LAB — I-STAT TROPONIN, ED: Troponin i, poc: 0 ng/mL (ref 0.00–0.08)

## 2014-06-25 LAB — COMPREHENSIVE METABOLIC PANEL
ALK PHOS: 82 U/L (ref 39–117)
ALT: 35 U/L (ref 0–53)
ANION GAP: 17 — AB (ref 5–15)
AST: 21 U/L (ref 0–37)
Albumin: 3.6 g/dL (ref 3.5–5.2)
BUN: 10 mg/dL (ref 6–23)
CHLORIDE: 103 meq/L (ref 96–112)
CO2: 18 mEq/L — ABNORMAL LOW (ref 19–32)
Calcium: 9.3 mg/dL (ref 8.4–10.5)
Creatinine, Ser: 1.01 mg/dL (ref 0.50–1.35)
GFR calc non Af Amer: >90 mL/min (ref >90)
GLUCOSE: 87 mg/dL (ref 70–99)
POTASSIUM: 4 meq/L (ref 3.7–5.3)
Sodium: 138 mEq/L (ref 137–147)
Total Protein: 7.3 g/dL (ref 6.0–8.3)

## 2014-06-25 LAB — ETHANOL: Alcohol, Ethyl (B): 14 mg/dL — ABNORMAL HIGH (ref 0–11)

## 2014-06-25 LAB — TROPONIN I: Troponin I: <0.3 ng/mL (ref <0.30)

## 2014-06-25 MED ORDER — MORPHINE SULFATE 4 MG/ML IJ SOLN
4.0000 mg | Freq: Once | INTRAMUSCULAR | Status: AC
  Administered 2014-06-25: 4 mg via INTRAVENOUS
  Filled 2014-06-25: qty 1

## 2014-06-25 MED ORDER — ASPIRIN 81 MG PO CHEW
324.0000 mg | CHEWABLE_TABLET | Freq: Once | ORAL | Status: AC
  Administered 2014-06-25: 324 mg via ORAL
  Filled 2014-06-25: qty 4

## 2014-06-25 MED ORDER — IBUPROFEN 400 MG PO TABS: 600.0000 mg | ORAL_TABLET | Freq: Three times a day (TID) | ORAL | Status: DC | PRN

## 2014-06-25 MED ORDER — ONDANSETRON HCL 4 MG PO TABS: 4.0000 mg | ORAL_TABLET | Freq: Three times a day (TID) | ORAL | Status: DC | PRN

## 2014-06-25 MED ORDER — ACETAMINOPHEN 325 MG PO TABS: 650.0000 mg | ORAL_TABLET | Freq: Four times a day (QID) | ORAL | Status: DC | PRN

## 2014-06-25 MED ORDER — NICOTINE POLACRILEX 2 MG MT GUM
2.0000 mg | CHEWING_GUM | OROMUCOSAL | Status: DC | PRN
  Administered 2014-06-25: 2 mg via ORAL
  Filled 2014-06-25 (×3): qty 1

## 2014-06-25 MED ORDER — BENZONATATE 100 MG PO CAPS
200.0000 mg | ORAL_CAPSULE | Freq: Three times a day (TID) | ORAL | Status: DC | PRN
  Administered 2014-06-25 – 2014-06-26 (×2): 200 mg via ORAL
  Filled 2014-06-25 (×2): qty 2

## 2014-06-25 MED ORDER — HYDROXYZINE HCL 25 MG PO TABS: 25.0000 mg | ORAL_TABLET | Freq: Four times a day (QID) | ORAL | Status: DC | PRN

## 2014-06-25 MED ORDER — ALUM & MAG HYDROXIDE-SIMETH 200-200-20 MG/5ML PO SUSP
30.0000 mL | ORAL | Status: DC | PRN
  Administered 2014-06-26: 30 mL via ORAL
  Filled 2014-06-25: qty 30

## 2014-06-25 MED ORDER — ZOLPIDEM TARTRATE 5 MG PO TABS: 5.0000 mg | ORAL_TABLET | Freq: Every evening | ORAL | Status: DC | PRN

## 2014-06-25 MED ORDER — MAGNESIUM HYDROXIDE 400 MG/5ML PO SUSP: 30.0000 mL | Freq: Every day | ORAL | Status: DC | PRN

## 2014-06-25 MED ORDER — METHOCARBAMOL 500 MG PO TABS
500.0000 mg | ORAL_TABLET | Freq: Four times a day (QID) | ORAL | Status: DC | PRN
  Administered 2014-06-25: 500 mg via ORAL
  Filled 2014-06-25: qty 1

## 2014-06-25 MED ORDER — GUAIFENESIN ER 600 MG PO TB12
1200.0000 mg | ORAL_TABLET | Freq: Two times a day (BID) | ORAL | Status: DC
  Administered 2014-06-25 – 2014-06-26 (×2): 1200 mg via ORAL
  Filled 2014-06-25 (×6): qty 2

## 2014-06-25 MED ORDER — ACETAMINOPHEN 325 MG PO TABS
650.0000 mg | ORAL_TABLET | ORAL | Status: DC | PRN
Start: 2014-06-25 — End: 2014-06-25

## 2014-06-25 MED ORDER — LORAZEPAM 1 MG PO TABS: 1.0000 mg | ORAL_TABLET | Freq: Three times a day (TID) | ORAL | Status: DC | PRN

## 2014-06-25 NOTE — ED Provider Notes (Signed)
CSN: [id number]     Arrival date & time 06/25/14  0548 History   First MD Initiated Contact with Patient 06/25/14 0645     Chief Complaint  Patient presents with  . Chest Pain  . Suicidal     (Consider location/radiation/quality/duration/timing/severity/associated sxs/prior Treatment) HPI Comments: Patient is a 38 year old male with a past medical history of GERD and substance abuse who presents with chest pain and suicidal ideation. Patient reports the chest pain started yesterday evening around 8pm. Patient reports using cocaine around 3am which made his chest pain worse. The pain is sharp and severe and located in his left chest with radiation to his left shoulder. He reports associated cough with "black phlegm." No associated symptoms. The pain has been constant since the onset. Patient also reports being suicidal. Patient reports using cocaine because "he doesn't care about his life." He also reports alcohol use. He'll drink "whatever he can get his hands on." He denies HI.    Past Medical History  Diagnosis Date  . GERD (gastroesophageal reflux disease)     states he has some but not "diagnosed"  . History of kidney stones   . Gallstone    Past Surgical History  Procedure Laterality Date  . Appendectomy    . Hand surgery    . Fracture surgery    . 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 Cholecystectomy      2011  . Hardware removal  04/04/2012    Procedure: HARDWARE REMOVAL;  Surgeon: Schuyler Amor, MD;  Location: [place];  Service: Orthopedics;  Laterality: Right;  right hand hardware removal/tenolysis   No family history on file. History  Substance Use Topics  . Smoking status: Current Every Day Smoker -- 1.00 packs/day    Types: Cigarettes  . Smokeless tobacco: Never Used  . Alcohol Use: Yes     Comment: social drinkier    Review of Systems  Constitutional: Negative for fever, chills and fatigue.  HENT: Negative for  trouble swallowing.   Eyes: Negative for visual disturbance.  Respiratory: Negative for shortness of breath.   Cardiovascular: Positive for chest pain. Negative for palpitations.  Gastrointestinal: Negative for nausea, vomiting, abdominal pain and diarrhea.  Genitourinary: Negative for dysuria and difficulty urinating.  Musculoskeletal: Negative for arthralgias and neck pain.  Skin: Negative for color change.  Neurological: Negative for dizziness and weakness.  Psychiatric/Behavioral: Positive for suicidal ideas. Negative for dysphoric mood.      Allergies  Review of patient's allergies indicates no known allergies.  Home Medications   Prior to Admission medications   Medication Sig Start Date End Date Taking? Authorizing Provider  HYDROcodone-acetaminophen (NORCO/VICODIN) 5-325 MG per tablet Take 1-2 tablets by mouth every 6 (six) hours as needed for moderate pain or severe pain. 03/26/14  Yes Britt Bottom, NP  methocarbamol (ROBAXIN) 500 MG tablet Take 500 mg by mouth every 6 (six) hours as needed for muscle spasms.   Yes Historical Provider, MD  diazepam (VALIUM) 5 MG tablet Take 1 tablet (5 mg total) by mouth 2 (two) times daily. Patient not taking: Reported on 06/25/2014 03/26/14   Britt Bottom, NP   BP 149/89 mmHg  Pulse 87  Resp 16  SpO2 97% Physical Exam  Constitutional: He is oriented to person, place, and time. He appears well-developed and well-nourished. No distress.  HENT:  Head: Normocephalic and atraumatic.  Eyes: Conjunctivae and EOM are normal.  Neck: Normal range of  motion.  Cardiovascular: Normal rate and regular rhythm.  Exam reveals no gallop and no friction rub.   No murmur heard. Pulmonary/Chest: Effort normal and breath sounds normal. He has no wheezes. He has no rales. He exhibits no tenderness.  Abdominal: Soft. He exhibits no distension. There is no tenderness. There is no rebound.  Musculoskeletal: Normal range of motion.  Neurological:  He is alert and oriented to person, place, and time. Coordination normal.  Speech is goal-oriented. Moves limbs without ataxia.   Skin: Skin is warm and dry.  Psychiatric: He has a normal mood and affect. His behavior is normal.  Nursing note and vitals reviewed.   ED Course  Procedures (including critical care time) Labs Review Labs Reviewed  COMPREHENSIVE METABOLIC PANEL - Abnormal; Notable for the following:    CO2 18 (*)    Total Bilirubin <0.2 (*)    Anion gap 17 (*)    All other components within normal limits  ETHANOL - Abnormal; Notable for the following:    Alcohol, Ethyl (B) 14 (*)    All other components within normal limits  URINE RAPID DRUG SCREEN (HOSP PERFORMED) - Abnormal; Notable for the following:    Cocaine POSITIVE (*)    All other components within normal limits  CBC WITH DIFFERENTIAL  TROPONIN I  Randolm Idol, ED    Imaging Review Dg Chest 2 View  06/25/2014   CLINICAL DATA:  Left-sided chest pain. The urine shortness of breath.  EXAM: CHEST  2 VIEW  COMPARISON:  05/29/2011  FINDINGS: Normal heart size and mediastinal contours. No acute infiltrate or edema. No effusion or pneumothorax. No acute osseous findings.  IMPRESSION: No active cardiopulmonary disease.   Electronically Signed   By: Jorje Guild M.D.   On: 06/25/2014 07:09     EKG Interpretation   Date/Time:  Monday June 25 2014 06:03:48 EST Ventricular Rate:  89 PR Interval:  178 QRS Duration: 98 QT Interval:  368 QTC Calculation: 447 R Axis:   90 Text Interpretation:  Normal sinus rhythm Rightward axis Borderline ECG No  old tracing to compare Confirmed by BELFI  MD, MELANIE ([id number]) on  06/25/2014 8:55:52 AM      MDM   Final diagnoses:  Chest pain   8:56 AM Vitals stable and patient afebrile. Troponin negative. Remaining labs unremarkable. Patient continues to have chest pain. Patient will have morphine for pain.   9:45 AM Patient's second troponin negative. Patient's  pain non concerning for cardiac chest pain at this time. Patient will be evaluated by TTS.   Alvina Chou, PA-C 06/25/14 [place], MD 06/26/14 [phone]

## 2014-06-25 NOTE — ED Notes (Signed)
Pharmacologist now at bedside.

## 2014-06-25 NOTE — BHH Counselor (Signed)
Pt has been accepted to observation unit bed 5 at [facility] per Debarah Crape, [place], M.S., LPCA, [facility] Licensed Professional Counselor Associate  Triage Specialist  [facility]  Therapeutic Triage Services Phone: [phone] Fax: [phone]

## 2014-06-25 NOTE — Progress Notes (Signed)
Pt alert, oriented and cooperative. Affect/mood sad and depressed. -SI/HI, verbally contracts for safety. "I used all the things that bring me down as a excuse to use drugs".-A/[place]. Pt has a productive cough and runny nose "I think its bronchitis".  Emotional support and encouragement given. Will continue to monitor closely and evaluate for stabilization.

## 2014-06-25 NOTE — BHH Counselor (Signed)
Faxed support paperwork to [place], [place] for observation unit.   Bedelia Person, M.S., LPCA, [facility] Licensed Professional Counselor Associate  Triage Specialist  [facility]  Therapeutic Triage Services Phone: [phone] Fax: [phone]

## 2014-06-25 NOTE — ED Notes (Signed)
Pt reports he did about $100 worth of cocaine about 30 mins ago and is experiencing left sided CP and has been coughing up black phlegm. Pt tearfully stated that he has been having suicidal thoughts and does cocaine to hurt himself.

## 2014-06-25 NOTE — BH Assessment (Signed)
Writer informed TTS Kristin of the consult.  

## 2014-06-25 NOTE — Progress Notes (Signed)
Patient ID: Kerry Wiley, male   DOB: [birth year], 38 y.o.   MRN: [id number] Nursing admission note:  Patient is a 38 yo male who presented to [facility] with chest pain after using cocaine this morning.  He also informed staff in ED that he was having suicidal thoughts with no specific plan.  Patient states that he has been self medicating since the death of his 8 yo daughter in 41.  His father was recently diagnosed with Alzheimers and his mother has had a hard time with this.  Patient states, "My daughter died and I've been using cocaine to cope and I know I can't do that."  Patient also lost his job recently.  Patient rates his depressive symptoms as a 10.  He reports decreased sleep, concentration, crying spells and occassional suicidal ideation.  He denies any SI/HI/AVH at admission to [facility].  He reports occasional alcohol use and denies any other drug use other than cocaine.  Patient c/o chest pain of 4 and asked for hydrocodone or oxycodone.  He has some respiratory symptoms of congestion and productive cough.  Patient was given food and fluid and oriented to observation unit.
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
## 2014-06-25 NOTE — ED Notes (Signed)
Patient to be transferred to [facility] observation unit.

## 2014-06-25 NOTE — ED Notes (Signed)
Cell Phone and 1 dollar placed in safe

## 2014-06-25 NOTE — ED Notes (Signed)
Dr Yelverton at bedside.  
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
## 2014-06-25 NOTE — ED Notes (Signed)
Valuables and belongings bag given to [employer].

## 2014-06-26 DIAGNOSIS — F4323 Adjustment disorder with mixed anxiety and depressed mood: Secondary | ICD-10-CM | POA: Diagnosis not present

## 2014-06-26 MED ORDER — GUAIFENESIN ER 600 MG PO TB12: 600.0000 mg | ORAL_TABLET | Freq: Two times a day (BID) | ORAL | Status: DC

## 2014-06-26 MED ORDER — HYDROXYZINE HCL 25 MG PO TABS: 25.0000 mg | ORAL_TABLET | Freq: Four times a day (QID) | ORAL | Status: DC | PRN

## 2014-06-26 MED ORDER — GUAIFENESIN 100 MG/5ML PO SYRP
200.0000 mg | ORAL_SOLUTION | ORAL | Status: DC | PRN
  Filled 2014-06-26: qty 10

## 2014-06-26 MED ORDER — BENZONATATE 100 MG PO CAPS
100.0000 mg | ORAL_CAPSULE | Freq: Three times a day (TID) | ORAL | Status: DC
  Administered 2014-06-26: 100 mg via ORAL
  Filled 2014-06-26 (×4): qty 1

## 2014-06-26 MED ORDER — GUAIFENESIN ER 600 MG PO TB12
600.0000 mg | ORAL_TABLET | Freq: Two times a day (BID) | ORAL | Status: DC
  Filled 2014-06-26 (×2): qty 1

## 2014-06-26 NOTE — BHH Counselor (Signed)
Set up services with ADS pt has appointment for initial consult on Thursday at 10:30a. He has been given appointment information and contact as well as other outpatient resources.   Bedelia Person, M.S., LPCA, [facility] Licensed Professional Counselor Associate  Triage Specialist  [facility]  Therapeutic Triage Services Phone: [phone] Fax: [phone]
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
## 2014-06-26 NOTE — Progress Notes (Signed)
Pt's ride is expected to arrive after 1500.

## 2014-06-26 NOTE — Progress Notes (Signed)
Pt discharged to lobby, awaiting ride, in no acute distress. Euthymic mood. He denied SI, HI, a/v disturbances. Reviewed AVS. Pt signed for belongings and verified they were returned.

## 2014-06-26 NOTE — Progress Notes (Signed)
D: Pt denies SI, HI and a/v disturbances. He complained of a productive cough as well as non-cardiac chest pain of a 6 upon inhalation. Provider has been aware. Pt calm and cooperative throughout morning.   A: Emotional support provided. Medications given as ordered. Safety maintained.  R: Pt is resting and does not appear in acute distress. Will continue to monitor.

## 2014-07-11 ENCOUNTER — Emergency Department (HOSPITAL_BASED_OUTPATIENT_CLINIC_OR_DEPARTMENT_OTHER)
Admission: EM | Admit: 2014-07-11 | Discharge: 2014-07-11 | Disposition: A | Discharge status: Home / Self Care | Payer: Medicaid Other | Attending: Emergency Medicine | Admitting: Emergency Medicine

## 2014-07-11 ENCOUNTER — Emergency Department (HOSPITAL_BASED_OUTPATIENT_CLINIC_OR_DEPARTMENT_OTHER): Payer: Medicaid Other

## 2014-07-11 ENCOUNTER — Encounter (HOSPITAL_BASED_OUTPATIENT_CLINIC_OR_DEPARTMENT_OTHER): Payer: Self-pay | Admitting: Emergency Medicine

## 2014-07-11 DIAGNOSIS — Z8719 Personal history of other diseases of the digestive system: Secondary | ICD-10-CM | POA: Insufficient documentation

## 2014-07-11 DIAGNOSIS — Y929 Unspecified place or not applicable: Secondary | ICD-10-CM | POA: Diagnosis not present

## 2014-07-11 DIAGNOSIS — Y998 Other external cause status: Secondary | ICD-10-CM | POA: Diagnosis not present

## 2014-07-11 DIAGNOSIS — Z720 Tobacco use: Secondary | ICD-10-CM | POA: Diagnosis not present

## 2014-07-11 DIAGNOSIS — Z87442 Personal history of urinary calculi: Secondary | ICD-10-CM | POA: Insufficient documentation

## 2014-07-11 DIAGNOSIS — S63501A Unspecified sprain of right wrist, initial encounter: Secondary | ICD-10-CM | POA: Diagnosis not present

## 2014-07-11 DIAGNOSIS — S6991XA Unspecified injury of right wrist, hand and finger(s), initial encounter: Secondary | ICD-10-CM | POA: Diagnosis present

## 2014-07-11 DIAGNOSIS — Z79899 Other long term (current) drug therapy: Secondary | ICD-10-CM | POA: Diagnosis not present

## 2014-07-11 DIAGNOSIS — Y9389 Activity, other specified: Secondary | ICD-10-CM | POA: Insufficient documentation

## 2014-07-11 DIAGNOSIS — W1839XA Other fall on same level, initial encounter: Secondary | ICD-10-CM | POA: Insufficient documentation

## 2014-07-11 DIAGNOSIS — R52 Pain, unspecified: Secondary | ICD-10-CM

## 2014-07-11 MED ORDER — TRAMADOL HCL 50 MG PO TABS: 50.0000 mg | ORAL_TABLET | Freq: Four times a day (QID) | ORAL | Status: DC | PRN

## 2014-07-11 NOTE — ED Notes (Signed)
Pt reports that he fell and put his right hand and wrist down to catch hisself, and awoke with pain

## 2014-07-11 NOTE — ED Provider Notes (Signed)
CSN: [id number]     Arrival date & time 07/11/14  0107 History   First MD Initiated Contact with Patient 07/11/14 0122     Chief Complaint  Patient presents with  . Wrist Pain     (Consider location/radiation/quality/duration/timing/severity/associated sxs/prior Treatment) Patient is a 38 y.o. male presenting with wrist pain. The history is provided by the patient.  Wrist Pain This is a new (was walking into the gas station and slipped on the water and fell on his right hand) problem. The current episode started 1 to 2 hours ago. The problem occurs constantly. The problem has been rapidly worsening. Associated symptoms comments: Only pain.  NO deformity, numbness or tingling. The symptoms are aggravated by bending and twisting. Nothing relieves the symptoms. He has tried rest for the symptoms. The treatment provided no relief.    Past Medical History  Diagnosis Date  . GERD (gastroesophageal reflux disease)     states he has some but not "diagnosed"  . History of kidney stones   . Gallstone    Past Surgical History  Procedure Laterality Date  . Appendectomy    . Hand surgery    . Fracture surgery    . 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 Cholecystectomy      2011  . Hardware removal  04/04/2012    Procedure: HARDWARE REMOVAL;  Surgeon: Schuyler Amor, MD;  Location: [place];  Service: Orthopedics;  Laterality: Right;  right hand hardware removal/tenolysis   History reviewed. No pertinent family history. History  Substance Use Topics  . Smoking status: Current Every Day Smoker -- 1.00 packs/day    Types: Cigarettes  . Smokeless tobacco: Never Used  . Alcohol Use: Yes     Comment: social drinkier    Review of Systems  All other systems reviewed and are negative.     Allergies  Review of patient's allergies indicates no known allergies.  Home Medications   Prior to Admission medications   Medication Sig Start Date End  Date Taking? Authorizing Provider  guaiFENesin (MUCINEX) 600 MG 12 hr tablet Take 1 tablet (600 mg total) by mouth 2 (two) times daily. 06/26/14   Benjamine Mola, FNP  HYDROcodone-acetaminophen (NORCO/VICODIN) 5-325 MG per tablet Take 1-2 tablets by mouth every 6 (six) hours as needed for moderate pain or severe pain. 03/26/14   Britt Bottom, NP  hydrOXYzine (ATARAX/VISTARIL) 25 MG tablet Take 1 tablet (25 mg total) by mouth every 6 (six) hours as needed for anxiety. 06/26/14   Benjamine Mola, FNP  methocarbamol (ROBAXIN) 500 MG tablet Take 500 mg by mouth every 6 (six) hours as needed for muscle spasms.    Historical Provider, MD  traMADol (ULTRAM) 50 MG tablet Take 1 tablet (50 mg total) by mouth every 6 (six) hours as needed. 07/11/14   Blanchie Dessert, MD   BP 131/84 mmHg  Pulse 88  Temp(Src) 98.5 F (36.9 C) (Oral)  Resp 20  Ht 6\' 2"  (1.88 m)  Wt 245 lb (111.131 kg)  BMI 31.44 kg/m2  SpO2 100% Physical Exam  Constitutional: He is oriented to person, place, and time. He appears well-developed and well-nourished. No distress.  HENT:  Head: Normocephalic and atraumatic.  Cardiovascular: Normal rate.   Pulmonary/Chest: Effort normal.  Musculoskeletal:       Right wrist: He exhibits decreased range of motion and tenderness. He exhibits no swelling, no effusion and no deformity.  No right snuffbox tenderness  Neurological: He is alert and oriented to person, place, and time.  Skin: Skin is warm and dry.  Psychiatric: He has a normal mood and affect. His behavior is normal.  Nursing note and vitals reviewed.   ED Course  Procedures (including critical care time) Labs Review Labs Reviewed - No data to display  Imaging Review Dg Wrist Complete Right  07/11/2014   CLINICAL DATA:  Fall this morning with wrist injury. Right wrist pain.  EXAM: RIGHT WRIST - COMPLETE 3+ VIEW  COMPARISON:  12/12/2012  FINDINGS: No acute fracture or malalignment.  Posttraumatic deformity of the  fourth and fifth metacarpals, with evidence of previous fixation hardware in the fourth metacarpal. No posttraumatic arthritis.  IMPRESSION: 1. No acute osseous findings. 2. Remote, healed fourth and fifth metacarpal fractures.   Electronically Signed   By: Jorje Guild M.D.   On: 07/11/2014 01:40     EKG Interpretation None      MDM   Final diagnoses:  Wrist sprain, right, initial encounter    Patient with a mechanical fall and pain in his right wrist. Patient has tenderness over the dorsal wrist. Neurovascularly intact. No elbow tenderness or snuffbox tenderness. Imaging is negative for acute findings. Patient placed in a wrist splint    Blanchie Dessert, MD 07/11/14 0200

## 2015-01-29 ENCOUNTER — Emergency Department (HOSPITAL_BASED_OUTPATIENT_CLINIC_OR_DEPARTMENT_OTHER)
Admission: EM | Admit: 2015-01-29 | Discharge: 2015-01-29 | Disposition: A | Discharge status: Home / Self Care | Payer: Medicaid Other | Attending: Emergency Medicine | Admitting: Emergency Medicine

## 2015-01-29 ENCOUNTER — Encounter (HOSPITAL_BASED_OUTPATIENT_CLINIC_OR_DEPARTMENT_OTHER): Payer: Self-pay | Admitting: *Deleted

## 2015-01-29 DIAGNOSIS — Z720 Tobacco use: Secondary | ICD-10-CM | POA: Diagnosis not present

## 2015-01-29 DIAGNOSIS — M545 Low back pain, unspecified: Secondary | ICD-10-CM

## 2015-01-29 DIAGNOSIS — Z79899 Other long term (current) drug therapy: Secondary | ICD-10-CM | POA: Insufficient documentation

## 2015-01-29 DIAGNOSIS — Z8719 Personal history of other diseases of the digestive system: Secondary | ICD-10-CM | POA: Insufficient documentation

## 2015-01-29 DIAGNOSIS — Z87442 Personal history of urinary calculi: Secondary | ICD-10-CM | POA: Insufficient documentation

## 2015-01-29 LAB — URINE MICROSCOPIC-ADD ON

## 2015-01-29 LAB — URINALYSIS, ROUTINE W REFLEX MICROSCOPIC
Bilirubin Urine: NEGATIVE
Glucose, UA: NEGATIVE mg/dL
Hgb urine dipstick: NEGATIVE
Ketones, ur: NEGATIVE mg/dL
NITRITE: NEGATIVE
PH: 5.5 (ref 5.0–8.0)
Protein, ur: NEGATIVE mg/dL
Specific Gravity, Urine: 1.022 (ref 1.005–1.030)
Urobilinogen, UA: 0.2 mg/dL (ref 0.0–1.0)

## 2015-01-29 MED ORDER — OXYCODONE-ACETAMINOPHEN 5-325 MG PO TABS
1.0000 | ORAL_TABLET | Freq: Four times a day (QID) | ORAL | Status: DC | PRN
Start: 2015-01-29 — End: 2015-08-12

## 2015-01-29 MED ORDER — DIAZEPAM 5 MG PO TABS
5.0000 mg | ORAL_TABLET | Freq: Once | ORAL | Status: AC
  Administered 2015-01-29: 5 mg via ORAL
  Filled 2015-01-29: qty 1

## 2015-01-29 MED ORDER — OXYCODONE-ACETAMINOPHEN 5-325 MG PO TABS
1.0000 | ORAL_TABLET | Freq: Once | ORAL | Status: AC
  Administered 2015-01-29: 1 via ORAL
  Filled 2015-01-29: qty 1

## 2015-01-29 MED ORDER — NAPROXEN 500 MG PO TABS: 500.0000 mg | ORAL_TABLET | Freq: Two times a day (BID) | ORAL | Status: DC

## 2015-01-29 MED ORDER — CYCLOBENZAPRINE HCL 5 MG PO TABS: 5.0000 mg | ORAL_TABLET | Freq: Three times a day (TID) | ORAL | Status: DC | PRN

## 2015-01-29 NOTE — Discharge Instructions (Signed)
Return to the ED with any concerns including weakness of legs, not able to urinate, loss of control of bowel or bladder, fever/chills, decreased level of alertness/lethargy, or any other alarming symptoms °

## 2015-01-29 NOTE — ED Notes (Signed)
Chronic back pain. States he was doing dry wall and pain started in his lower back x 4 days.

## 2015-01-29 NOTE — ED Provider Notes (Signed)
CSN: [id number]     Arrival date & time 01/29/15  1306 History   First MD Initiated Contact with Patient 01/29/15 1327     Chief Complaint  Patient presents with  . Back Pain     (Consider location/radiation/quality/duration/timing/severity/associated sxs/prior Treatment) HPI  Pt presenting with c/o low back pain.  Pt has hx of prior low back pain approx 1 year ago- was told he had muscle spasms at that time.  This week he has been installing dry wall and pain started in his low back.  Has been ongoing for the past 4 days.  Pain is in low back and right of midline.  No weakness of legs, no retention of urine, no incontinence of bowel or bladder.  No fever/chills.  He has tried vicodin which did not help his pain.  There are no other associated systemic symptoms, there are no other alleviating or modifying factors.   Past Medical History  Diagnosis Date  . GERD (gastroesophageal reflux disease)     states he has some but not "diagnosed"  . History of kidney stones   . Gallstone    Past Surgical History  Procedure Laterality Date  . Appendectomy    . Hand surgery    . Fracture surgery    . 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 Cholecystectomy      2011  . Hardware removal  04/04/2012    Procedure: HARDWARE REMOVAL;  Surgeon: Schuyler Amor, MD;  Location: [place];  Service: Orthopedics;  Laterality: Right;  right hand hardware removal/tenolysis   No family history on file. History  Substance Use Topics  . Smoking status: Current Every Day Smoker -- 1.00 packs/day    Types: Cigarettes  . Smokeless tobacco: Never Used  . Alcohol Use: Yes     Comment: social drinkier    Review of Systems  ROS reviewed and all otherwise negative except for mentioned in HPI    Allergies  Review of patient's allergies indicates no known allergies.  Home Medications   Prior to Admission medications   Medication Sig Start Date End Date Taking?  Authorizing Provider  cyclobenzaprine (FLEXERIL) 5 MG tablet Take 1 tablet (5 mg total) by mouth 3 (three) times daily as needed for muscle spasms. 01/29/15   Alfonzo Beers, MD  guaiFENesin (MUCINEX) 600 MG 12 hr tablet Take 1 tablet (600 mg total) by mouth 2 (two) times daily. 06/26/14   Benjamine Mola, FNP  HYDROcodone-acetaminophen (NORCO/VICODIN) 5-325 MG per tablet Take 1-2 tablets by mouth every 6 (six) hours as needed for moderate pain or severe pain. 03/26/14   Britt Bottom, NP  hydrOXYzine (ATARAX/VISTARIL) 25 MG tablet Take 1 tablet (25 mg total) by mouth every 6 (six) hours as needed for anxiety. 06/26/14   Benjamine Mola, FNP  methocarbamol (ROBAXIN) 500 MG tablet Take 500 mg by mouth every 6 (six) hours as needed for muscle spasms.    Historical Provider, MD  naproxen (NAPROSYN) 500 MG tablet Take 1 tablet (500 mg total) by mouth 2 (two) times daily. 01/29/15   Alfonzo Beers, MD  oxyCODONE-acetaminophen (PERCOCET/ROXICET) 5-325 MG per tablet Take 1-2 tablets by mouth every 6 (six) hours as needed for severe pain. 01/29/15   Alfonzo Beers, MD  traMADol (ULTRAM) 50 MG tablet Take 1 tablet (50 mg total) by mouth every 6 (six) hours as needed. 07/11/14   Blanchie Dessert, MD   BP 143/93 mmHg  Pulse 78  Temp(Src) 98.4 F (36.9 C) (Oral)  Resp 18  Ht 6\' 2"  (1.88 m)  Wt 245 lb (111.131 kg)  BMI 31.44 kg/m2  SpO2 100%  Vitals reviewed Physical Exam  Physical Examination: General appearance - alert, well appearing, and in no distress Mental status - alert, oriented to person, place, and time Eyes - no conjunctival injection no scleral icterus Chest - clear to auscultation, no wheezes, rales or rhonchi, symmetric air entry Heart - normal rate, regular rhythm, normal S1, S2, no murmurs, rubs, clicks or gallops Back exam - no midline tenderness to palpation, tenderness in paraspinal distribution on right side of lumbar region, no CVA tenderness Neurological - alert, oriented, strength  5/5 in lower extremities, sensation intact,  Musculoskeletal - no joint tenderness, deformity or swelling Extremities - peripheral pulses normal, no pedal edema, no clubbing or cyanosis Skin - normal coloration and turgor, no rashes  ED Course  Procedures (including critical care time) Labs Review Labs Reviewed  URINALYSIS, ROUTINE W REFLEX MICROSCOPIC (NOT AT [facility]) - Abnormal; Notable for the following:    APPearance CLOUDY (*)    Leukocytes, UA SMALL (*)    All other components within normal limits  URINE MICROSCOPIC-ADD ON - Abnormal; Notable for the following:    Squamous Epithelial / LPF MANY (*)    Bacteria, UA MANY (*)    All other components within normal limits    Imaging Review No results found.   EKG Interpretation None      MDM   Final diagnoses:  Right-sided low back pain without sciatica    Pt presenting with flare up of right low back pain similar to prior pain episodes.  Normal neurologic exam.  No signs or symptoms of cauda equina.  No trauma to warrant imaging.  No fever to suggest epidural abscess.  Pt treated with pain meds, muscle relaxer, anti inflammatories.  Discharged with strict return precautions.  Pt agreeable with plan.  Prior records reviewed and considered during this visit Nursing notes including past medical history and social history reviewed and considered in documentation     Alfonzo Beers, MD 01/30/15 [phone]

## 2015-05-16 ENCOUNTER — Encounter (HOSPITAL_COMMUNITY): Payer: Self-pay

## 2015-05-16 ENCOUNTER — Emergency Department (HOSPITAL_COMMUNITY): Payer: Medicaid Other

## 2015-05-16 DIAGNOSIS — Z720 Tobacco use: Secondary | ICD-10-CM | POA: Diagnosis not present

## 2015-05-16 DIAGNOSIS — Z87442 Personal history of urinary calculi: Secondary | ICD-10-CM | POA: Diagnosis not present

## 2015-05-16 DIAGNOSIS — K219 Gastro-esophageal reflux disease without esophagitis: Secondary | ICD-10-CM | POA: Insufficient documentation

## 2015-05-16 DIAGNOSIS — B349 Viral infection, unspecified: Secondary | ICD-10-CM | POA: Diagnosis not present

## 2015-05-16 DIAGNOSIS — R52 Pain, unspecified: Secondary | ICD-10-CM | POA: Diagnosis present

## 2015-05-16 NOTE — ED Notes (Signed)
Pt here for generalized body aches onset 4 days ago associated with muscle spasms. Pt reports blood tinged sputum with cough.

## 2015-05-17 ENCOUNTER — Emergency Department (HOSPITAL_COMMUNITY)
Admission: EM | Admit: 2015-05-17 | Discharge: 2015-05-17 | Disposition: A | Discharge status: Home / Self Care | Payer: Medicaid Other | Attending: Emergency Medicine | Admitting: Emergency Medicine

## 2015-05-17 DIAGNOSIS — B349 Viral infection, unspecified: Secondary | ICD-10-CM

## 2015-05-17 MED ORDER — TRAMADOL HCL 50 MG PO TABS
50.0000 mg | ORAL_TABLET | Freq: Once | ORAL | Status: AC
  Administered 2015-05-17: 50 mg via ORAL
  Filled 2015-05-17: qty 1

## 2015-05-17 MED ORDER — ALBUTEROL (5 MG/ML) CONTINUOUS INHALATION SOLN
10.0000 mg/h | INHALATION_SOLUTION | RESPIRATORY_TRACT | Status: DC
  Administered 2015-05-17: 10 mg/h via RESPIRATORY_TRACT
  Filled 2015-05-17: qty 20

## 2015-05-17 MED ORDER — IPRATROPIUM BROMIDE 0.02 % IN SOLN
1.0000 mg | Freq: Once | RESPIRATORY_TRACT | Status: AC
  Administered 2015-05-17: 1 mg via RESPIRATORY_TRACT
  Filled 2015-05-17: qty 5

## 2015-05-17 MED ORDER — BENZONATATE 100 MG PO CAPS
200.0000 mg | ORAL_CAPSULE | Freq: Once | ORAL | Status: AC
Start: 2015-05-17 — End: 2015-05-17
  Administered 2015-05-17: 200 mg via ORAL
  Filled 2015-05-17 (×2): qty 2

## 2015-05-17 MED ORDER — MAGNESIUM SULFATE 2 GM/50ML IV SOLN
2.0000 g | Freq: Once | INTRAVENOUS | Status: AC
  Administered 2015-05-17: 2 g via INTRAVENOUS
  Filled 2015-05-17: qty 50

## 2015-05-17 MED ORDER — KETOROLAC TROMETHAMINE 30 MG/ML IJ SOLN
30.0000 mg | Freq: Once | INTRAMUSCULAR | Status: AC
  Administered 2015-05-17: 30 mg via INTRAVENOUS
  Filled 2015-05-17: qty 1

## 2015-05-17 MED ORDER — DEXAMETHASONE SODIUM PHOSPHATE 10 MG/ML IJ SOLN
10.0000 mg | Freq: Once | INTRAMUSCULAR | Status: AC
  Administered 2015-05-17: 10 mg via INTRAVENOUS
  Filled 2015-05-17: qty 1

## 2015-05-17 MED ORDER — SODIUM CHLORIDE 0.9 % IV BOLUS (SEPSIS)
1000.0000 mL | Freq: Once | INTRAVENOUS | Status: AC
  Administered 2015-05-17: 1000 mL via INTRAVENOUS

## 2015-05-17 NOTE — ED Provider Notes (Signed)
CSN: [id number]   Arrival date & time 05/16/15 2311  History  By signing my name below, I, Altamease Oiler, attest that this documentation has been prepared under the direction and in the presence of Everlene Balls, MD. Electronically Signed: Altamease Oiler, ED Scribe. 05/17/2015. 1:49 AM.  Chief Complaint  Patient presents with  . Generalized Body Aches    HPI The history is provided by the patient. No language interpreter was used.   Kerry Wiley is a 39 y.o. male who presents to the Emergency Department complaining of new and diffuse myalgias with onset 4 days ago. Associated symptoms include fever up to 101, sweating, headache,  chest congestion, cough productive of yellow/green/blood tinged sputum. The cough has improved with OTC cough medication. He is unsure of whether or not he had a flu shot this year. His daughter had a cough that has improved and his sister has similar symptoms. No history of asthma or COPD.   Past Medical History  Diagnosis Date  . GERD (gastroesophageal reflux disease)     states he has some but not "diagnosed"  . History of kidney stones   . Gallstone     Past Surgical History  Procedure Laterality Date  . Appendectomy    . Hand surgery    . Fracture surgery    . 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 Cholecystectomy      2011  . Hardware removal  04/04/2012    Procedure: HARDWARE REMOVAL;  Surgeon: Schuyler Amor, MD;  Location: [place];  Service: Orthopedics;  Laterality: Right;  right hand hardware removal/tenolysis    No family history on file.  Social History  Substance Use Topics  . Smoking status: Current Every Day Smoker -- 1.00 packs/day    Types: Cigarettes  . Smokeless tobacco: Never Used  . Alcohol Use: Yes     Comment: social drinkier     Review of Systems  Constitutional: Positive for fever, chills and diaphoresis.  HENT: Positive for congestion.   Respiratory: Positive for cough.    Musculoskeletal: Positive for myalgias.  10 Systems reviewed and all are negative for acute change except as noted in the HPI.   Home Medications   Prior to Admission medications   Medication Sig Start Date End Date Taking? Authorizing Provider  diazepam (VALIUM) 5 MG tablet Take 5 mg by mouth every 12 (twelve) hours as needed for anxiety.   Yes Historical Provider, MD  methocarbamol (ROBAXIN) 500 MG tablet Take 500 mg by mouth every 6 (six) hours as needed for muscle spasms.   Yes Historical Provider, MD  omeprazole (PRILOSEC) 20 MG capsule Take 20 mg by mouth daily.   Yes Historical Provider, MD  oxyCODONE-acetaminophen (PERCOCET) 10-325 MG tablet Take 1 tablet by mouth every 4 (four) hours as needed for pain.   Yes Historical Provider, MD  cyclobenzaprine (FLEXERIL) 5 MG tablet Take 1 tablet (5 mg total) by mouth 3 (three) times daily as needed for muscle spasms. Patient not taking: Reported on 05/17/2015 01/29/15   Alfonzo Beers, MD  guaiFENesin (MUCINEX) 600 MG 12 hr tablet Take 1 tablet (600 mg total) by mouth 2 (two) times daily. Patient not taking: Reported on 05/17/2015 06/26/14   Benjamine Mola, FNP  HYDROcodone-acetaminophen (NORCO/VICODIN) 5-325 MG per tablet Take 1-2 tablets by mouth every 6 (six) hours as needed for moderate pain or severe pain. Patient not taking: Reported on 05/17/2015 03/26/14   Britt Bottom, NP  hydrOXYzine (ATARAX/VISTARIL) 25 MG tablet Take 1 tablet (25 mg total) by mouth every 6 (six) hours as needed for anxiety. Patient not taking: Reported on 05/17/2015 06/26/14   Benjamine Mola, FNP  naproxen (NAPROSYN) 500 MG tablet Take 1 tablet (500 mg total) by mouth 2 (two) times daily. Patient not taking: Reported on 05/17/2015 01/29/15   Alfonzo Beers, MD  oxyCODONE-acetaminophen (PERCOCET/ROXICET) 5-325 MG per tablet Take 1-2 tablets by mouth every 6 (six) hours as needed for severe pain. Patient not taking: Reported on 05/17/2015 01/29/15   Alfonzo Beers, MD   traMADol (ULTRAM) 50 MG tablet Take 1 tablet (50 mg total) by mouth every 6 (six) hours as needed. Patient not taking: Reported on 05/17/2015 07/11/14   Blanchie Dessert, MD    Allergies  Prednisone  Triage Vitals: BP 137/74 mmHg  Pulse 77  Temp(Src) 98.3 F (36.8 C) (Oral)  Resp 20  Ht 6\' 2"  (1.88 m)  Wt 240 lb (108.863 kg)  BMI 30.80 kg/m2  SpO2 94%  Physical Exam  Constitutional: He is oriented to person, place, and time. Vital signs are normal. He appears well-developed and well-nourished.  Non-toxic appearance. He does not appear ill. No distress.  HENT:  Head: Normocephalic and atraumatic.  Nose: Nose normal.  Mouth/Throat: Oropharynx is clear and moist. No oropharyngeal exudate.  Eyes: Conjunctivae and EOM are normal. Pupils are equal, round, and reactive to light. No scleral icterus.  Neck: Normal range of motion. Neck supple. No tracheal deviation, no edema, no erythema and normal range of motion present. No thyroid mass and no thyromegaly present.  Cardiovascular: Normal rate, regular rhythm, S1 normal, S2 normal, normal heart sounds, intact distal pulses and normal pulses.  Exam reveals no gallop and no friction rub.   No murmur heard. Pulses:      Radial pulses are 2+ on the right side, and 2+ on the left side.       Dorsalis pedis pulses are 2+ on the right side, and 2+ on the left side.  Pulmonary/Chest: Effort normal. No respiratory distress. He has wheezes. He has rhonchi. He has no rales.  Bilateral wheezing and rhonchi in posterior fields with frequent coughing   Abdominal: Soft. Normal appearance and bowel sounds are normal. He exhibits no distension, no ascites and no mass. There is no hepatosplenomegaly. There is no tenderness. There is no rebound, no guarding and no CVA tenderness.  Musculoskeletal: Normal range of motion. He exhibits no edema or tenderness.  Lymphadenopathy:    He has no cervical adenopathy.  Neurological: He is alert and oriented to  person, place, and time. He has normal strength. No cranial nerve deficit or sensory deficit.  Skin: Skin is warm, dry and intact. No petechiae and no rash noted. He is not diaphoretic. No erythema. No pallor.  Psychiatric: He has a normal mood and affect. His behavior is normal. Judgment normal.  Nursing note and vitals reviewed.   ED Course  Procedures   DIAGNOSTIC STUDIES: Oxygen Saturation is 94% on RA, normal by my interpretation.    COORDINATION OF CARE: 1:21 AM Discussed treatment plan which includes CXR and a breathing treatment with pt at bedside and pt agreed to plan.  Labs Reviewed - No data to display  Imaging Review Dg Chest 2 View  05/16/2015  CLINICAL DATA:  Coughing up blood, congestion, shortness of breath, and right-sided pain for 4 days. Current smoker. EXAM: CHEST  2 VIEW COMPARISON:  06/25/2014 FINDINGS: Mild hyperinflation. The heart size and  mediastinal contours are within normal limits. Both lungs are clear. The visualized skeletal structures are unremarkable. IMPRESSION: No active cardiopulmonary disease. Electronically Signed   By: Lucienne Capers M.D.   On: 05/16/2015 23:54    I personally reviewed and evaluated these images as a part of my medical decision-making.     MDM   Final diagnoses:  None    patient presents to the emergency department for multiple complaints including productive cough, fever at home as high as 101, sweats, diffuse body aches , congestion. This is consistent with a viral syndrome possibly influenza. On examination patient is wheezing and coughing. Chest x-ray is negative for pneumonia. Patient given albuterol, ipratropium, Decadron ( patient is allergic to prednisone), and magnesium for treatment. Also provided Toradol and Tessalon Perles for symptomatic care. Patient was educated on viral syndrome  And supportive care.  Upon repeat evaluation, patients wheezing has resolved, he also feels better.  His VS remain within his normal  limits and he is safe for DC.   I personally performed the services described in this documentation, which was scribed in my presence. The recorded information has been reviewed and is accurate.      Everlene Balls, MD 05/17/15 [phone]

## 2015-05-17 NOTE — ED Notes (Signed)
MD at bedside. 

## 2015-05-17 NOTE — Progress Notes (Signed)
CAT started. Pt is stable at this time.  

## 2015-05-17 NOTE — Discharge Instructions (Signed)
Viral Infections Kerry Wiley, you have a viral infection.  Continue to take ibuprofen and mucinex as needed for congestion and muscle aches.  See a primary care doctor within 3 days for close follow up.  If symptoms worsen, come back to the ED immediately.  Thank you. A virus is a type of germ. Viruses can cause:  Minor sore throats.  Aches and pains.  Headaches.  Runny nose.  Rashes.  Watery eyes.  Tiredness.  Coughs.  Loss of appetite.  Feeling sick to your stomach (nausea).  Throwing up (vomiting).  Watery poop (diarrhea). HOME CARE   Only take medicines as told by your doctor.  Drink enough water and fluids to keep your pee (urine) clear or pale yellow. Sports drinks are a good choice.  Get plenty of rest and eat healthy. Soups and broths with crackers or rice are fine. GET HELP RIGHT AWAY IF:   You have a very bad headache.  You have shortness of breath.  You have chest pain or neck pain.  You have an unusual rash.  You cannot stop throwing up.  You have watery poop that does not stop.  You cannot keep fluids down.  You or your child has a temperature by mouth above 102 F (38.9 C), not controlled by medicine.  Your baby is older than 3 months with a rectal temperature of 102 F (38.9 C) or higher.  Your baby is 33 months old or younger with a rectal temperature of 100.4 F (38 C) or higher. MAKE SURE YOU:   Understand these instructions.  Will watch this condition.  Will get help right away if you are not doing well or get worse.   This information is not intended to replace advice given to you by your health care provider. Make sure you discuss any questions you have with your health care provider.   Document Released: 06/11/2008 Document Revised: 09/21/2011 Document Reviewed: 12/05/2014 Elsevier Interactive Patient Education [employer].

## 2015-08-09 ENCOUNTER — Other Ambulatory Visit (HOSPITAL_COMMUNITY): Payer: Self-pay | Admitting: Specialist

## 2015-08-12 ENCOUNTER — Encounter (HOSPITAL_COMMUNITY): Payer: Self-pay

## 2015-08-12 ENCOUNTER — Encounter (HOSPITAL_COMMUNITY)
Admission: RE | Admit: 2015-08-12 | Discharge: 2015-08-12 | Disposition: A | Discharge status: Home / Self Care | Payer: Medicaid Other | Source: Ambulatory Visit | Attending: Specialist | Admitting: Specialist

## 2015-08-12 DIAGNOSIS — Z01812 Encounter for preprocedural laboratory examination: Secondary | ICD-10-CM | POA: Diagnosis present

## 2015-08-12 DIAGNOSIS — M5126 Other intervertebral disc displacement, lumbar region: Secondary | ICD-10-CM | POA: Insufficient documentation

## 2015-08-12 HISTORY — DX: Malignant (primary) neoplasm, unspecified: C80.1

## 2015-08-12 LAB — SURGICAL PCR SCREEN
MRSA, PCR: NEGATIVE
Staphylococcus aureus: NEGATIVE

## 2015-08-12 LAB — COMPREHENSIVE METABOLIC PANEL
ALBUMIN: 3.7 g/dL (ref 3.5–5.0)
ALT: 25 U/L (ref 17–63)
ANION GAP: 10 (ref 5–15)
AST: 20 U/L (ref 15–41)
Alkaline Phosphatase: 69 U/L (ref 38–126)
BILIRUBIN TOTAL: 0.2 mg/dL — AB (ref 0.3–1.2)
BUN: 10 mg/dL (ref 6–20)
CHLORIDE: 108 mmol/L (ref 101–111)
CO2: 24 mmol/L (ref 22–32)
Calcium: 9.3 mg/dL (ref 8.9–10.3)
Creatinine, Ser: 1.13 mg/dL (ref 0.61–1.24)
GFR calc Af Amer: >60 mL/min (ref >60)
GFR calc non Af Amer: >60 mL/min (ref >60)
GLUCOSE: 98 mg/dL (ref 65–99)
POTASSIUM: 4.2 mmol/L (ref 3.5–5.1)
SODIUM: 142 mmol/L (ref 135–145)
TOTAL PROTEIN: 6.8 g/dL (ref 6.5–8.1)

## 2015-08-12 LAB — CBC
HEMATOCRIT: 44.2 % (ref 39.0–52.0)
Hemoglobin: 14.8 g/dL (ref 13.0–17.0)
MCH: 28.5 pg (ref 26.0–34.0)
MCHC: 33.5 g/dL (ref 30.0–36.0)
MCV: 85 fL (ref 78.0–100.0)
PLATELETS: 222 10*3/uL (ref 150–400)
RBC: 5.2 MIL/uL (ref 4.22–5.81)
RDW: 15.2 % (ref 11.5–15.5)
WBC: 7.3 10*3/uL (ref 4.0–10.5)

## 2015-08-12 MED ORDER — CEFAZOLIN SODIUM-DEXTROSE 2-3 GM-% IV SOLR
2.0000 g | INTRAVENOUS | Status: AC
  Administered 2015-08-13: 2 g via INTRAVENOUS
  Filled 2015-08-12: qty 50

## 2015-08-12 MED ORDER — CHLORHEXIDINE GLUCONATE 4 % EX LIQD: 60.0000 mL | Freq: Once | CUTANEOUS | Status: DC

## 2015-08-12 NOTE — Pre-Procedure Instructions (Addendum)
[place]  08/12/2015      [facility], [place] [address] Phone: [phone] Fax: [phone]  [id number] [place], [place] [id number] [facility] [place] [id number] Phone: [phone] Fax: [phone]  [facility] [address] [place], [place] - [address] [place] [address] Phone: [phone] Fax: [phone]    Your procedure is scheduled on1/31/17.  Report to [facility] Admitting at 1200 A.M.  Call this number if you have problems the morning of surgery:  [phone]   Remember:  Do not eat food or drink liquids after midnight.  Take these medicines the morning of surgery with A SIP OF WATER robaxin, oxycodone if needed, prilosec   STOP all herbel meds, nsaids (aleve,naproxen,advil,ibuprofen) starting NOW including vitamins, aspirin   Do not wear jewelry, make-up or nail polish.  Do not wear lotions, powders, or perfumes.  You may wear deodorant.  Do not shave 48 hours prior to surgery.  Men may shave face and neck.  Do not bring valuables to the hospital.  [facility] is not responsible for any belongings or valuables.  Contacts, dentures or bridgework may not be worn into surgery.  Leave your suitcase in the car.  After surgery it may be brought to your room.  For patients admitted to the hospital, discharge time will be determined by your treatment team.  Patients discharged the day of surgery will not be allowed to drive home.   Name and phone number of your driver:    Special instructions:   Special Instructions: [place] - Preparing for Surgery  Before surgery, you can play an important role.  Because skin is not sterile, your skin needs to be as free of germs as possible.  You can reduce the number of germs on you skin by washing with  CHG (chlorahexidine gluconate) soap before surgery.  CHG is an antiseptic cleaner which kills germs and bonds with the skin to continue killing germs even after washing.  Please DO NOT use if you have an allergy to CHG or antibacterial soaps.  If your skin becomes reddened/irritated stop using the CHG and inform your nurse when you arrive at Short Stay.  Do not shave (including legs and underarms) for at least 48 hours prior to the first CHG shower.  You may shave your face.  Please follow these instructions carefully:   1.  Shower with CHG Soap the night before surgery and the morning of Surgery.  2.  If you choose to wash your hair, wash your hair first as usual with your normal shampoo.  3.  After you shampoo, rinse your hair and body thoroughly to remove the Shampoo.  4.  Use CHG as you would any other liquid soap.  You can apply chg directly  to the skin and wash gently with scrungie or a clean washcloth.  5.  Apply the CHG Soap to your body ONLY FROM THE NECK DOWN.  Do not use on open wounds or open sores.  Avoid contact with your eyes ears, mouth and genitals (private parts).  Wash genitals (private parts)       with your normal soap.  6.  Wash thoroughly, paying special attention to the area where your surgery will be performed.  7.  Thoroughly rinse your body with warm water  from the neck down.  8.  DO NOT shower/wash with your normal soap after using and rinsing off the CHG Soap.  9.  Pat yourself dry with a clean towel.            10.  Wear clean pajamas.            11.  Place clean sheets on your bed the night of your first shower and do not sleep with pets.  Day of Surgery  Do not apply any lotions/deodorants the morning of surgery.  Please wear clean clothes to the hospital/surgery center.  Please read over the following fact sheets that you were given. Pain Booklet, Coughing and Deep Breathing, MRSA Information and Surgical Site Infection Prevention

## 2015-08-13 ENCOUNTER — Ambulatory Visit (HOSPITAL_COMMUNITY): Payer: Medicaid Other | Admitting: Anesthesiology

## 2015-08-13 ENCOUNTER — Encounter (HOSPITAL_COMMUNITY)
Admission: RE | Disposition: A | Discharge status: Home / Self Care | Payer: Medicaid Other | Source: Ambulatory Visit | Attending: Specialist

## 2015-08-13 ENCOUNTER — Ambulatory Visit (HOSPITAL_COMMUNITY): Payer: Medicaid Other

## 2015-08-13 ENCOUNTER — Ambulatory Visit (HOSPITAL_COMMUNITY)
Admission: RE | Admit: 2015-08-13 | Discharge: 2015-08-16 | Disposition: A | Discharge status: Home / Self Care | Payer: Medicaid Other | Source: Ambulatory Visit | Attending: Specialist | Admitting: Specialist

## 2015-08-13 ENCOUNTER — Encounter (HOSPITAL_COMMUNITY): Payer: Self-pay | Admitting: *Deleted

## 2015-08-13 DIAGNOSIS — F17210 Nicotine dependence, cigarettes, uncomplicated: Secondary | ICD-10-CM | POA: Insufficient documentation

## 2015-08-13 DIAGNOSIS — J069 Acute upper respiratory infection, unspecified: Secondary | ICD-10-CM | POA: Clinically undetermined

## 2015-08-13 DIAGNOSIS — M5126 Other intervertebral disc displacement, lumbar region: Secondary | ICD-10-CM | POA: Insufficient documentation

## 2015-08-13 DIAGNOSIS — M160 Bilateral primary osteoarthritis of hip: Secondary | ICD-10-CM | POA: Diagnosis not present

## 2015-08-13 DIAGNOSIS — B9789 Other viral agents as the cause of diseases classified elsewhere: Secondary | ICD-10-CM

## 2015-08-13 DIAGNOSIS — R05 Cough: Secondary | ICD-10-CM

## 2015-08-13 DIAGNOSIS — R21 Rash and other nonspecific skin eruption: Secondary | ICD-10-CM | POA: Diagnosis not present

## 2015-08-13 DIAGNOSIS — Z419 Encounter for procedure for purposes other than remedying health state, unspecified: Secondary | ICD-10-CM

## 2015-08-13 DIAGNOSIS — M48062 Spinal stenosis, lumbar region with neurogenic claudication: Secondary | ICD-10-CM | POA: Diagnosis present

## 2015-08-13 DIAGNOSIS — R058 Other specified cough: Secondary | ICD-10-CM

## 2015-08-13 HISTORY — PX: LUMBAR LAMINECTOMY/DECOMPRESSION MICRODISCECTOMY: SHX5026

## 2015-08-13 SURGERY — LUMBAR LAMINECTOMY/DECOMPRESSION MICRODISCECTOMY
Anesthesia: General | Site: Back

## 2015-08-13 MED ORDER — MIDAZOLAM HCL 2 MG/2ML IJ SOLN
INTRAMUSCULAR | Status: AC
  Filled 2015-08-13: qty 2

## 2015-08-13 MED ORDER — PROPOFOL 10 MG/ML IV BOLUS
INTRAVENOUS | Status: AC
  Filled 2015-08-13: qty 20

## 2015-08-13 MED ORDER — SODIUM CHLORIDE 0.9 % IV SOLN: 250.0000 mL | INTRAVENOUS | Status: DC

## 2015-08-13 MED ORDER — ACETAMINOPHEN 325 MG PO TABS: 650.0000 mg | ORAL_TABLET | ORAL | Status: DC | PRN

## 2015-08-13 MED ORDER — METHOCARBAMOL 500 MG PO TABS
ORAL_TABLET | ORAL | Status: AC
  Filled 2015-08-13: qty 1

## 2015-08-13 MED ORDER — SODIUM CHLORIDE 0.9% FLUSH
3.0000 mL | Freq: Two times a day (BID) | INTRAVENOUS | Status: DC
Start: 2015-08-13 — End: 2015-08-16
  Administered 2015-08-15 (×2): 3 mL via INTRAVENOUS

## 2015-08-13 MED ORDER — HYDROMORPHONE HCL 1 MG/ML IJ SOLN
INTRAMUSCULAR | Status: AC
  Administered 2015-08-13: 0.5 mg via INTRAVENOUS
  Filled 2015-08-13: qty 1

## 2015-08-13 MED ORDER — LACTATED RINGERS IV SOLN
INTRAVENOUS | Status: DC
  Administered 2015-08-13: 22:00:00 via INTRAVENOUS

## 2015-08-13 MED ORDER — OXYCODONE-ACETAMINOPHEN 5-325 MG PO TABS
ORAL_TABLET | ORAL | Status: AC
  Filled 2015-08-13: qty 2

## 2015-08-13 MED ORDER — KETOROLAC TROMETHAMINE 30 MG/ML IJ SOLN
30.0000 mg | Freq: Once | INTRAMUSCULAR | Status: AC
  Administered 2015-08-13: 30 mg via INTRAVENOUS

## 2015-08-13 MED ORDER — OXYCODONE HCL 5 MG/5ML PO SOLN: 5.0000 mg | Freq: Once | ORAL | Status: DC | PRN

## 2015-08-13 MED ORDER — BISACODYL 5 MG PO TBEC
5.0000 mg | DELAYED_RELEASE_TABLET | Freq: Every day | ORAL | Status: DC | PRN
  Administered 2015-08-16: 5 mg via ORAL
  Filled 2015-08-13 (×2): qty 1

## 2015-08-13 MED ORDER — OXYCODONE HCL 5 MG PO TABS: 5.0000 mg | ORAL_TABLET | Freq: Once | ORAL | Status: DC | PRN

## 2015-08-13 MED ORDER — MIDAZOLAM HCL 5 MG/5ML IJ SOLN
INTRAMUSCULAR | Status: DC | PRN
  Administered 2015-08-13 (×2): 1 mg via INTRAVENOUS

## 2015-08-13 MED ORDER — OXYCODONE-ACETAMINOPHEN 10-325 MG PO TABS: 1.0000 | ORAL_TABLET | ORAL | Status: DC | PRN

## 2015-08-13 MED ORDER — BUPIVACAINE LIPOSOME 1.3 % IJ SUSP
20.0000 mL | Freq: Once | INTRAMUSCULAR | Status: DC
  Filled 2015-08-13: qty 20

## 2015-08-13 MED ORDER — ACETAMINOPHEN 650 MG RE SUPP: 650.0000 mg | RECTAL | Status: DC | PRN

## 2015-08-13 MED ORDER — LIDOCAINE HCL (CARDIAC) 20 MG/ML IV SOLN
INTRAVENOUS | Status: DC | PRN
  Administered 2015-08-13: 60 mg via INTRAVENOUS

## 2015-08-13 MED ORDER — SODIUM CHLORIDE 0.9% FLUSH: 3.0000 mL | INTRAVENOUS | Status: DC | PRN

## 2015-08-13 MED ORDER — OXYCODONE-ACETAMINOPHEN 5-325 MG PO TABS
1.0000 | ORAL_TABLET | ORAL | Status: DC | PRN
  Administered 2015-08-13 – 2015-08-14 (×2): 2 via ORAL
  Filled 2015-08-13 (×2): qty 2

## 2015-08-13 MED ORDER — CEFAZOLIN SODIUM 1-5 GM-% IV SOLN
1.0000 g | Freq: Three times a day (TID) | INTRAVENOUS | Status: AC
  Administered 2015-08-13 – 2015-08-14 (×2): 1 g via INTRAVENOUS
  Filled 2015-08-13 (×2): qty 50

## 2015-08-13 MED ORDER — ONDANSETRON HCL 4 MG/2ML IJ SOLN: 4.0000 mg | INTRAMUSCULAR | Status: DC | PRN

## 2015-08-13 MED ORDER — PROPOFOL 10 MG/ML IV BOLUS
INTRAVENOUS | Status: DC | PRN
  Administered 2015-08-13: 200 mg via INTRAVENOUS

## 2015-08-13 MED ORDER — THROMBIN 20000 UNITS EX KIT
PACK | CUTANEOUS | Status: DC | PRN
  Administered 2015-08-13: 20000 [IU] via TOPICAL

## 2015-08-13 MED ORDER — LACTATED RINGERS IV SOLN
INTRAVENOUS | Status: DC
  Administered 2015-08-13 (×2): via INTRAVENOUS

## 2015-08-13 MED ORDER — METHOCARBAMOL 500 MG PO TABS
500.0000 mg | ORAL_TABLET | Freq: Four times a day (QID) | ORAL | Status: DC | PRN
  Administered 2015-08-13 – 2015-08-15 (×5): 500 mg via ORAL
  Filled 2015-08-13 (×4): qty 1

## 2015-08-13 MED ORDER — MENTHOL 3 MG MT LOZG: 1.0000 | LOZENGE | OROMUCOSAL | Status: DC | PRN

## 2015-08-13 MED ORDER — PHENOL 1.4 % MT LIQD
1.0000 | OROMUCOSAL | Status: DC | PRN
  Filled 2015-08-13: qty 177

## 2015-08-13 MED ORDER — BUPIVACAINE HCL (PF) 0.5 % IJ SOLN
INTRAMUSCULAR | Status: AC
  Filled 2015-08-13: qty 30

## 2015-08-13 MED ORDER — ONDANSETRON HCL 4 MG/2ML IJ SOLN
INTRAMUSCULAR | Status: DC | PRN
  Administered 2015-08-13: 4 mg via INTRAVENOUS

## 2015-08-13 MED ORDER — PANTOPRAZOLE SODIUM 40 MG PO TBEC
40.0000 mg | DELAYED_RELEASE_TABLET | Freq: Every day | ORAL | Status: DC
  Administered 2015-08-13 – 2015-08-16 (×4): 40 mg via ORAL
  Filled 2015-08-13 (×4): qty 1

## 2015-08-13 MED ORDER — HYDROMORPHONE HCL 1 MG/ML IJ SOLN
0.2500 mg | INTRAMUSCULAR | Status: DC | PRN
  Administered 2015-08-13 (×4): 0.5 mg via INTRAVENOUS

## 2015-08-13 MED ORDER — KETOROLAC TROMETHAMINE 30 MG/ML IJ SOLN
INTRAMUSCULAR | Status: AC
  Filled 2015-08-13: qty 1

## 2015-08-13 MED ORDER — GELATIN ABSORBABLE 12-7 MM EX MISC
CUTANEOUS | Status: DC | PRN
  Administered 2015-08-13: 1

## 2015-08-13 MED ORDER — HYDROCODONE-ACETAMINOPHEN 5-325 MG PO TABS
1.0000 | ORAL_TABLET | ORAL | Status: DC | PRN
  Administered 2015-08-13 – 2015-08-15 (×2): 2 via ORAL
  Filled 2015-08-13 (×2): qty 2

## 2015-08-13 MED ORDER — DOCUSATE SODIUM 100 MG PO CAPS
100.0000 mg | ORAL_CAPSULE | Freq: Two times a day (BID) | ORAL | Status: DC
  Administered 2015-08-13 – 2015-08-16 (×6): 100 mg via ORAL
  Filled 2015-08-13 (×7): qty 1

## 2015-08-13 MED ORDER — MORPHINE SULFATE (PF) 2 MG/ML IV SOLN
1.0000 mg | INTRAVENOUS | Status: DC | PRN
  Administered 2015-08-13 – 2015-08-14 (×6): 4 mg via INTRAVENOUS
  Administered 2015-08-14: 2 mg via INTRAVENOUS
  Administered 2015-08-15 (×5): 4 mg via INTRAVENOUS
  Administered 2015-08-15: 2 mg via INTRAVENOUS
  Administered 2015-08-16 (×2): 4 mg via INTRAVENOUS
  Filled 2015-08-13 (×13): qty 2
  Filled 2015-08-13: qty 1
  Filled 2015-08-13: qty 2

## 2015-08-13 MED ORDER — FENTANYL CITRATE (PF) 100 MCG/2ML IJ SOLN
INTRAMUSCULAR | Status: DC | PRN
  Administered 2015-08-13: 100 ug via INTRAVENOUS
  Administered 2015-08-13: 50 ug via INTRAVENOUS
  Administered 2015-08-13: 100 ug via INTRAVENOUS

## 2015-08-13 MED ORDER — PROMETHAZINE HCL 25 MG/ML IJ SOLN: 6.2500 mg | INTRAMUSCULAR | Status: DC | PRN

## 2015-08-13 MED ORDER — ROCURONIUM BROMIDE 100 MG/10ML IV SOLN
INTRAVENOUS | Status: DC | PRN
  Administered 2015-08-13: 50 mg via INTRAVENOUS

## 2015-08-13 MED ORDER — MORPHINE SULFATE (PF) 4 MG/ML IV SOLN
INTRAVENOUS | Status: AC
  Administered 2015-08-13: 4 mg
  Filled 2015-08-13: qty 1

## 2015-08-13 MED ORDER — THROMBIN 20000 UNITS EX SOLR
CUTANEOUS | Status: AC
  Filled 2015-08-13: qty 20000

## 2015-08-13 MED ORDER — POLYETHYLENE GLYCOL 3350 17 G PO PACK
17.0000 g | PACK | Freq: Every day | ORAL | Status: DC | PRN
  Administered 2015-08-15 – 2015-08-16 (×2): 17 g via ORAL
  Filled 2015-08-13 (×2): qty 1

## 2015-08-13 MED ORDER — BUPIVACAINE LIPOSOME 1.3 % IJ SUSP
INTRAMUSCULAR | Status: DC | PRN
  Administered 2015-08-13: 10 mL
  Administered 2015-08-13: 5 mL

## 2015-08-13 MED ORDER — ARTIFICIAL TEARS OP OINT
TOPICAL_OINTMENT | OPHTHALMIC | Status: DC | PRN
  Administered 2015-08-13: 1 via OPHTHALMIC

## 2015-08-13 MED ORDER — ALUM & MAG HYDROXIDE-SIMETH 200-200-20 MG/5ML PO SUSP: 30.0000 mL | Freq: Four times a day (QID) | ORAL | Status: DC | PRN

## 2015-08-13 MED ORDER — PHENYLEPHRINE HCL 10 MG/ML IJ SOLN
10.0000 mg | INTRAVENOUS | Status: DC | PRN
  Administered 2015-08-13: 40 ug/min via INTRAVENOUS

## 2015-08-13 MED ORDER — FENTANYL CITRATE (PF) 250 MCG/5ML IJ SOLN
INTRAMUSCULAR | Status: AC
  Filled 2015-08-13: qty 5

## 2015-08-13 MED ORDER — BUPIVACAINE HCL 0.5 % IJ SOLN
INTRAMUSCULAR | Status: DC | PRN
  Administered 2015-08-13: 5 mL
  Administered 2015-08-13: 10 mL

## 2015-08-13 MED ORDER — FLEET ENEMA 7-19 GM/118ML RE ENEM: 1.0000 | ENEMA | Freq: Once | RECTAL | Status: DC | PRN

## 2015-08-13 MED ORDER — METHOCARBAMOL 1000 MG/10ML IJ SOLN
500.0000 mg | Freq: Four times a day (QID) | INTRAVENOUS | Status: DC | PRN
  Filled 2015-08-13: qty 5

## 2015-08-13 MED ORDER — ZOLPIDEM TARTRATE 5 MG PO TABS: 5.0000 mg | ORAL_TABLET | Freq: Every evening | ORAL | Status: DC | PRN

## 2015-08-13 SURGICAL SUPPLY — 55 items
ADH SKN CLS APL DERMABOND .7 (GAUZE/BANDAGES/DRESSINGS) ×1
APL SKNCLS STERI-STRIP NONHPOA (GAUZE/BANDAGES/DRESSINGS) ×1
BENZOIN TINCTURE PRP APPL 2/3 (GAUZE/BANDAGES/DRESSINGS) ×2 IMPLANT
BUR NEURO DRILL SOFT 3.0X3.8M (BURR) ×2 IMPLANT
BUR RND FLUTED 2.5 (BURR) IMPLANT
BUR SABER RD CUTTING 3.0 (BURR) ×1 IMPLANT
BUR SABER RD CUTTING 3.0MM (BURR) ×1
CANISTER SUCTION 2500CC (MISCELLANEOUS) ×3 IMPLANT
CLOSURE STERI-STRIP 1/2X4 (GAUZE/BANDAGES/DRESSINGS) ×1
CLSR STERI-STRIP ANTIMIC 1/2X4 (GAUZE/BANDAGES/DRESSINGS) ×1 IMPLANT
COVER SURGICAL LIGHT HANDLE (MISCELLANEOUS) ×3 IMPLANT
DERMABOND ADVANCED (GAUZE/BANDAGES/DRESSINGS) ×2
DERMABOND ADVANCED .7 DNX12 (GAUZE/BANDAGES/DRESSINGS) ×1 IMPLANT
DRAPE INCISE IOBAN 66X45 STRL (DRAPES) IMPLANT
DRAPE MICROSCOPE LEICA (MISCELLANEOUS) ×3 IMPLANT
DRAPE PROXIMA HALF (DRAPES) ×2 IMPLANT
DRAPE SURG 17X23 STRL (DRAPES) ×12 IMPLANT
DRSG MEPILEX BORDER 4X4 (GAUZE/BANDAGES/DRESSINGS) ×2 IMPLANT
DRSG MEPILEX BORDER 4X8 (GAUZE/BANDAGES/DRESSINGS) IMPLANT
DURAPREP 26ML APPLICATOR (WOUND CARE) ×3 IMPLANT
ELECT REM PT RETURN 9FT ADLT (ELECTROSURGICAL) ×3
ELECTRODE REM PT RTRN 9FT ADLT (ELECTROSURGICAL) ×1 IMPLANT
EVACUATOR 1/8 PVC DRAIN (DRAIN) IMPLANT
GLOVE BIOGEL PI IND STRL 8 (GLOVE) ×1 IMPLANT
GLOVE BIOGEL PI INDICATOR 8 (GLOVE) ×2
GLOVE ECLIPSE 9.0 STRL (GLOVE) ×3 IMPLANT
GLOVE ORTHO TXT STRL SZ7.5 (GLOVE) ×3 IMPLANT
GLOVE SURG 8.5 LATEX PF (GLOVE) ×3 IMPLANT
GOWN STRL REUS W/ TWL LRG LVL3 (GOWN DISPOSABLE) ×1 IMPLANT
GOWN STRL REUS W/TWL 2XL LVL3 (GOWN DISPOSABLE) ×6 IMPLANT
GOWN STRL REUS W/TWL LRG LVL3 (GOWN DISPOSABLE) ×3
KIT BASIN OR (CUSTOM PROCEDURE TRAY) ×3 IMPLANT
KIT ROOM TURNOVER OR (KITS) ×3 IMPLANT
NDL SPNL 18GX3.5 QUINCKE PK (NEEDLE) ×2 IMPLANT
NEEDLE SPNL 18GX3.5 QUINCKE PK (NEEDLE) ×6 IMPLANT
NS IRRIG 1000ML POUR BTL (IV SOLUTION) ×3 IMPLANT
PACK LAMINECTOMY ORTHO (CUSTOM PROCEDURE TRAY) ×3 IMPLANT
PAD ARMBOARD 7.5X6 YLW CONV (MISCELLANEOUS) ×6 IMPLANT
PATTIES SURGICAL .5 X.5 (GAUZE/BANDAGES/DRESSINGS) IMPLANT
PATTIES SURGICAL .75X.75 (GAUZE/BANDAGES/DRESSINGS) IMPLANT
PATTIES SURGICAL 1X1 (DISPOSABLE) IMPLANT
SPONGE LAP 4X18 X RAY DECT (DISPOSABLE) IMPLANT
SPONGE SURGIFOAM ABS GEL 100 (HEMOSTASIS) ×2 IMPLANT
SUT VIC AB 0 CT1 27 (SUTURE)
SUT VIC AB 0 CT1 27XBRD ANBCTR (SUTURE) IMPLANT
SUT VIC AB 1 CT1 27 (SUTURE) ×3
SUT VIC AB 1 CT1 27XBRD ANBCTR (SUTURE) IMPLANT
SUT VIC AB 2-0 CT1 27 (SUTURE) ×3
SUT VIC AB 2-0 CT1 TAPERPNT 27 (SUTURE) IMPLANT
SUT VIC AB 3-0 X1 27 (SUTURE) ×3 IMPLANT
SUT VICRYL 0 UR6 27IN ABS (SUTURE) ×3 IMPLANT
TOWEL OR 17X24 6PK STRL BLUE (TOWEL DISPOSABLE) ×3 IMPLANT
TOWEL OR 17X26 10 PK STRL BLUE (TOWEL DISPOSABLE) ×3 IMPLANT
TRAY FOLEY CATH 16FRSI W/METER (SET/KITS/TRAYS/PACK) ×2 IMPLANT
WATER STERILE IRR 1000ML POUR (IV SOLUTION) ×1 IMPLANT

## 2015-08-13 NOTE — Discharge Instructions (Signed)
° ° °  No lifting greater than 10 lbs. °Avoid bending, stooping and twisting. °Walk in house for first week them may start to get out slowly increasing distance up to one quarter mile by 3 weeks post op. °Keep incision dry for 3 days, may use tegaderm or similar water impervious dressing. ° °

## 2015-08-13 NOTE — Transfer of Care (Signed)
Immediate Anesthesia Transfer of Care Note  Patient: Kerry Wiley  Procedure(s) Performed: Procedure(s): BILATERAL LATERAL RECESSED DECOMPRESSION L4 - L5 (N/A)  Patient Location: PACU  Anesthesia Type:General  Level of Consciousness: awake, oriented and patient cooperative  Airway & Oxygen Therapy: Patient Spontanous Breathing and Patient connected to nasal cannula oxygen  Post-op Assessment: Report given to RN, Post -op Vital signs reviewed and stable and Patient moving all extremities  Post vital signs: Reviewed and stable  Last Vitals:  Filed Vitals:   08/13/15 1152 08/13/15 1605  BP: 153/78 146/83  Pulse:  79  Temp:  36.7 C  Resp:  18    Complications: No apparent anesthesia complications

## 2015-08-13 NOTE — H&P (Signed)
Kerry Wiley is an 40 y.o. male.   Kerry Wiley returns today in followup of his lumbar spine.  He had an injury to his back in a work-related incident years ago.  He was apparently treated here locally and never had an MRI scan done.  I have reviewed a CT scan he had of his lumbar spine.  He has been experiencing pain in his back with radiation into the right leg greater than left.  The pain is more in the back than into his leg, though.  It has worsened with standing and ambulation.  He has pain with bending, stooping and twisting.  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  Most of Kerry Wiley's pain is into the lateral posterior right calf and into is right foot.  Primary care physician is Dr. Jeanie Cooks.  He also has been found on his radiographs to have bilateral hip osteoarthritis changes that are significant involving the superior acetabulum with subchondral sclerosis.  His hip disease is mild. We did try to obtain an MRI scan of Kerry Wiley's lumbar spine, but this apparently was denied by his insurer.    MEDICATIONS:  Robaxin, cyclobenzaprine, Percocet, and Prilosec.  He notes the Percocet does not seem to last long enough so that he can get by without having to come back for more medicine.   ALLERGIES:  NO KNOWN ALLERGIES.   SOCIAL HISTORY:  He does smoke. He smokes a pack of cigarettes per day for 15 years.  He is single.  He has done Architect work in the past.   FAMILY HISTORY:  Significant for cancer and high blood pressure.   PAST SURGICAL HISTORY:  He had previous knee and ankle surgery.  Had a previous knee replacement.       Past Medical History  Diagnosis Date  . GERD (gastroesophageal reflux disease)     states he has some but not "diagnosed"  . History of kidney stones   . Gallstone   . Cancer ([place]) 13    cyst? on rt kidney bx done     Past Surgical History  Procedure Laterality Date  . Appendectomy    . Hand surgery  Right 22    metal  . Cholecystectomy      2011  . Hardware removal  04/04/2012    Procedure: HARDWARE REMOVAL;  Surgeon: Schuyler Amor, MD;  Location: [facility];  Service: Orthopedics;  Laterality: Right;  right hand hardware removal/tenolysis  . Fracture surgery Right 89    leg plates, screws  . Joint replacement      knee and ankle replacement-1997    No family history on file. Social History:  reports that he has been smoking Cigarettes.  He has been smoking about 1.00 pack per day. He has never used smokeless tobacco. He reports that he drinks about 1.2 oz of alcohol per week. He reports that he uses illicit drugs (Cocaine).  Allergies:  Allergies  Allergen Reactions  . Prednisone Other (See Comments)    "has anger issues taking it"    No prescriptions prior to admission    Results for orders placed or performed during the hospital encounter of 08/12/15 (from the past 48 hour(s))  CBC     Status: None   Collection Time: 08/12/15  8:40 AM  Result Value Ref Range   WBC 7.3 4.0 - 10.5 K/uL   RBC 5.20 4.22 - 5.81 MIL/uL   Hemoglobin 14.8 13.0 -  17.0 g/dL   HCT 44.2 39.0 - 52.0 %   MCV 85.0 78.0 - 100.0 fL   MCH 28.5 26.0 - 34.0 pg   MCHC 33.5 30.0 - 36.0 g/dL   RDW 15.2 11.5 - 15.5 %   Platelets 222 150 - 400 K/uL  Comprehensive metabolic panel     Status: Abnormal   Collection Time: 08/12/15  8:40 AM  Result Value Ref Range   Sodium 142 135 - 145 mmol/L   Potassium 4.2 3.5 - 5.1 mmol/L   Chloride 108 101 - 111 mmol/L   CO2 24 22 - 32 mmol/L   Glucose, Bld 98 65 - 99 mg/dL   BUN 10 6 - 20 mg/dL   Creatinine, Ser 1.13 0.61 - 1.24 mg/dL   Calcium 9.3 8.9 - 10.3 mg/dL   Total Protein 6.8 6.5 - 8.1 g/dL   Albumin 3.7 3.5 - 5.0 g/dL   AST 20 15 - 41 U/L   ALT 25 17 - 63 U/L   Alkaline Phosphatase 69 38 - 126 U/L   Total Bilirubin 0.2 (L) 0.3 - 1.2 mg/dL   GFR calc non Af Amer >60 >60 mL/min   GFR calc Af Amer >60 >60 mL/min    Comment:  (NOTE) The eGFR has been calculated using the CKD EPI equation. This calculation has not been validated in all clinical situations. eGFR's persistently <60 mL/min signify possible Chronic Kidney Disease.    Anion gap 10 5 - 15  Surgical pcr screen     Status: None   Collection Time: 08/12/15  8:40 AM  Result Value Ref Range   MRSA, PCR NEGATIVE NEGATIVE   Staphylococcus aureus NEGATIVE NEGATIVE    Comment:        The Xpert SA Assay (FDA approved for NASAL specimens in patients over 68 years of age), is one component of a comprehensive surveillance program.  Test performance has been validated by [facility] for patients greater than or equal to 26 year old. It is not intended to diagnose infection nor to guide or monitor treatment.    No results found.  Review of Systems  Constitutional: Negative.   HENT: Negative.   Eyes: Negative.   Respiratory: Negative.   Cardiovascular: Negative.   Genitourinary: Negative.   Musculoskeletal: Positive for back pain.  Psychiatric/Behavioral: Negative.     There were no vitals taken for this visit. Physical Exam  Constitutional: He is oriented to person, place, and time. No distress.  HENT:  Head: Atraumatic.  Eyes: EOM are normal.  Neck: Normal range of motion.  Cardiovascular: Normal rate.   Respiratory: No respiratory distress.  GI: He exhibits no distension.  Musculoskeletal: He exhibits tenderness.  Neurological: He is alert and oriented to person, place, and time.  Skin: Skin is warm and dry.  Psychiatric: He has a normal mood and affect.    PHYSICAL EXAMINATION:  He has difficulty performing any toe-walking on the right side.  Reflexes at the knee are 0 and symmetric, at the ankle 0 and symmetric.  Sciatic tension tests cause pain on the right side about 10 to 15 degrees short of full extension.  Pain radiating below the right knee.  Popliteal compression sign is negative on both sides.  He is able to heel-walk without  demonstrable weakness.  ASSESSMENT:  Kerry Wiley remains painful.  He continues to require narcotic pain medicines to control pain in his back with radiation in the right leg greater than left.  He has  a previous study that is showing calcified disc protrusion at L4-5 and concerns relate to weakness in his right foot plantar flexion or S1 distribution.   PLAN: BILATERAL MICRODISCECTOMY WITH EXCISION OF HERNIATED NUCLEUS PULPOSUS, DECOMPRESSION L4-5 Will go ahead and try to reschedule this patient a noncontrast MRI scan of his lumbar spine in order to assess the areas of calcified protrusion as seen on his CT scan and determine whether or not he has a problem with ongoing nerve compression here.  His medication of Percocet is renewed, although I have explained to Kerry Wiley that I will not fill this prescription indefinitely and I believe that if his insurer will not allow for him to have an MRI scan, then we may not have a good reason for him to continue to take stronger narcotics. OWENS,JAMES M 08/13/2015, 7:12 AM

## 2015-08-13 NOTE — Anesthesia Postprocedure Evaluation (Signed)
Anesthesia Post Note  Patient: Kerry Wiley  Procedure(s) Performed: Procedure(s) (LRB): BILATERAL LATERAL RECESSED DECOMPRESSION L4 - L5 (N/A)  Patient location during evaluation: PACU Anesthesia Type: General Level of consciousness: sedated Pain management: pain level controlled Vital Signs Assessment: post-procedure vital signs reviewed and stable Respiratory status: spontaneous breathing and respiratory function stable Cardiovascular status: stable Anesthetic complications: no    Last Vitals:  Filed Vitals:   08/13/15 1152 08/13/15 1605  BP: 153/78 146/83  Pulse:  79  Temp:  36.7 C  Resp:  18    Last Pain:  Filed Vitals:   08/13/15 1613  PainSc: [place]

## 2015-08-13 NOTE — Anesthesia Procedure Notes (Signed)
Procedure Name: Intubation Date/Time: 08/13/2015 1:44 PM Performed by: Williemae Area B Pre-anesthesia Checklist: Patient identified, Emergency Drugs available, Suction available and Patient being monitored Patient Re-evaluated:Patient Re-evaluated prior to inductionOxygen Delivery Method: Circle system utilized Preoxygenation: Pre-oxygenation with 100% oxygen Intubation Type: IV induction Ventilation: Mask ventilation without difficulty Laryngoscope Size: Mac and 4 Grade View: Grade II Tube type: Oral Tube size: 7.5 mm Number of attempts: 1 Airway Equipment and Method: Stylet Secured at: 23 (cm at teeth) cm Tube secured with: Tape Dental Injury: Teeth and Oropharynx as per pre-operative assessment and Injury to lip  Comments: EMT did intubation under supervision of Dr. Ola Spurr and CRNA.  Minute pinch of upper lip against right incisor.  No bleeding.

## 2015-08-13 NOTE — Interval H&P Note (Signed)
Patient was seen and examined in the preop holding area. There has been no interval  Change in this patient's exam preop  history and physical exam  Lab tests and images have been examined and reviewed.  The Risks benefits and alternative treatments have been discussed  extensively,questions answered.  The patient has elected to undergo the discussed surgical treatment. 

## 2015-08-13 NOTE — Brief Op Note (Signed)
08/13/2015  3:33 PM  PATIENT:  Kerry Wiley  40 y.o. 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 Ola Spurr.  EBL: <75cc  DRAINS: None.  COMPLICATIONS: None  FINDINGS: Bilateral severe lateral recess stenosis, bilateral severe L5 nerve root entrapment, moderate bilateral L4 foramenal stenosis.Calcified herniated disc L4-5 with large osteophyte posterior inferior lip of L4.  DISPOSITION: To the recovery room extubated and in stable condition.  DICTATION ID:  Kerry Wiley E

## 2015-08-13 NOTE — Anesthesia Preprocedure Evaluation (Addendum)
Anesthesia Evaluation  Patient identified by MRN, date of birth, ID band Patient awake    Reviewed: Allergy & Precautions, NPO status , Patient's Chart, lab work & pertinent test results  Airway Mallampati: III  TM Distance: >3 FB Neck ROM: Full    Dental  (+) Dental Advisory Given, Teeth Intact   Pulmonary Current Smoker,    breath sounds clear to auscultation       Cardiovascular negative cardio ROS   Rhythm:Regular Rate:Normal     Neuro/Psych negative neurological ROS     GI/Hepatic Neg liver ROS, GERD  Medicated and Controlled,  Endo/Other  negative endocrine ROS  Renal/GU negative Renal ROS     Musculoskeletal   Abdominal   Peds  Hematology negative hematology ROS (+)   Anesthesia Other Findings Pt has good neck extension and excellent ability to open mouth.  Large tongue somewhat blocks view of pharynx.  Reproductive/Obstetrics                           Lab Results  Component Value Date   WBC 7.3 08/12/2015   HGB 14.8 08/12/2015   HCT 44.2 08/12/2015   MCV 85.0 08/12/2015   PLT 222 08/12/2015   Lab Results  Component Value Date   CREATININE 1.13 08/12/2015   BUN 10 08/12/2015   NA 142 08/12/2015   K 4.2 08/12/2015   CL 108 08/12/2015   CO2 24 08/12/2015    Anesthesia Physical Anesthesia Plan  ASA: II  Anesthesia Plan: General   Post-op Pain Management:    Induction: Intravenous  Airway Management Planned: Oral ETT  Additional Equipment:   Intra-op Plan:   Post-operative Plan: Extubation in OR  Informed Consent: I have reviewed the patients History and Physical, chart, labs and discussed the procedure including the risks, benefits and alternatives for the proposed anesthesia with the patient or authorized representative who has indicated his/her understanding and acceptance.   Dental advisory given  Plan Discussed with: CRNA  Anesthesia Plan Comments:          Anesthesia Quick Evaluation

## 2015-08-13 NOTE — Op Note (Signed)
08/13/2015  3:53 PM  PATIENT:  Kerry Wiley  40 y.o. 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    SURGEON:  Kerrin Champagne, MD     ASSISTANT:  Zonia Kief, PA-C  (Present throughout the entire procedure and necessary for completion of procedure in a timely manner)     ANESTHESIA:  General,supplemented with Marcaine half percent 1:1 exparel 1.3% total of 20 cc used, Dr. Sampson Goon.    COMPLICATIONS:  None.     COMPONENTS: None.  PROCEDURE:The patient was met in the holding area, and the appropriate right Lumbar level L4-5 identified and marked with "x" and my initials plan is for bilateral laminotomies at L4-5, leg pain is right greater than left..The patient was then transported to OR and was placed under general anesthesia without difficulty. The patient received appropriate preoperative antibiotic prophylaxis. The patient after intubation atraumatically was transferred to the operating room table, prone position, Wilson frame, sliding OR table. All pressure points were well padded. The arms in 90-90 well-padded at the elbows. Standard prep with Iodophor solution lower dorsal spine to the mid sacral segment. Draped in the usual manner iodine Vi-Drape was used. Time-out procedure was called and correct. 2x 18-gauge spinal needle was then inserted at the expected L4-5 level. Cross table lateral radiograph was used to identify the spinal needles positions. The lower needle was at the lower aspect of the lamina of L4. Skin superior to this was then infiltrated with Marcaine half percent 1:1 exparel 1.3% total of 20 cc used. An incision approximately an inch inch and a half in length was then made through skin and subcutaneous layers in line with the left side of the expected midline just  superior to the spinal needle entry point. An incision made into the left lumbosacral fascia approximately an inch in length .  Smallest dilator was then introduced into the incision site and used to carefully form subperiosteal dissection of the paralumbar muscles off of the posterior lamina of the expected L4-5 level. Successive dilators were then carried up to the 11 mm size. The depth measured off of the dilators at about 50 mm and 50 mm retractors then placed on the Fredrich Birks and guided down to and docking on the posterior aspect of the lamina at the expected L4-5 level. Cross table lateral radiograph identified a Kocher clamp appropriate level L4 spinous process. The operating room microscope sterilely draped brought into the field. Under the operating room microscope, the left and right L4-5 interspace carefully debrided the small amount of muscle attachment here and high-speed bur used to drill the medial aspect of the inferior articular process of L4 approximately 20% bilaterally. 2 mm Kerrison then used to enter the spinal canal over the superior aspect of the L5 lamina carefully using the Kerrison to debris the attachment as a curet. Foraminotomy was then performed over the left L5 nerve root. The medial 10% superior articular process of L5 and then resected using 2 mm Kerrison. This allowed for identification of the thecal sac. Penfield 4 was then used to carefully mobilize the thecal sac medially and the L5 nerve root identified within the lateral recess flattened over the posterior aspect of the protruded disc. Carefully the lateral aspect of the L5 nerve root was identified and a Nicholos Johns 4 was used to mobilize the  nerve medially such that the calcified disc protusion was visible with microscope. Using a Penfield 4 for retraction the disc posteriorly was explored and though protruded it did not have a soft disc herniation present. Further foraminotomies was performed over the L5 nerve root the  nerve root was noted to be exiting without further compression. The nerve root able to be retracted along the medial aspect of the L5 pedicle and the lateral recess decompress removing hypertrophic ligamentum flavum off the medial facet at L4-5 continuing superiorly until the L4 nerve root was noted to be exiting without further compression. The right L4-5 interspace carefully debrided the small amount of muscle attachment here and high-speed bur used to drill the medial aspect of the inferior articular process of L4 approximately 20% bilaterally. 2 mm Kerrison then used to enter the spinal canal over the superior aspect of the right L5 lamina carefully using the Kerrison to debris the attachment as a curet. Foraminotomy was then performed over the right L5 nerve root. The medial 10% superior articular process of L5 and then resected using 2 mm Kerrison. This allowed for identification of the thecal sac. Penfield 4 was then used to carefully mobilize the thecal sac medially and the right L5 nerve root identified within the lateral recess flattened over the posterior aspect of the protruded disc. Carefully the lateral aspect of the L5 nerve root was identified and a Penfield 4 was used to mobilize the nerve medially such that the calcified disc protusion was visible with microscope. Using a Penfield 4 for retraction the disc posteriorly was explored and though protruded it did not have a soft disc herniation present. Further foraminotomies was performed over the right L5 nerve root the nerve root was noted to be exiting without further compression. The nerve root able to be retracted along the medial aspect of the L5 pedicle and the lateral recess decompress removing hypertrophic ligamentum flavum off the medial facet at L4-5 continuing superiorly until the right L4 nerve root was noted to be exiting without further compression.  Upbiting currettes were used to further remove reflected portions of the reflected  portions of the medial L4-5 facet and portions of the superior portion of the L5 superior articular  Facet. Ligamentum flavum was debrided and lateral recess along the medial aspect L4-5 facet no further decompression was necessary. Ball tip nerve probe was then able to carefully palpate the neuroforamen for the bilateral L4 and bilateral L5 nerve roots finding these to be well decompressed. Bleeding was then controlled using thrombin-soaked Gelfoam small cottonoids. Small amount of bleeding within the soft tissue mass the laminotomy area was controlled using bipolar electrocautery. Irrigation was carried out using copious amounts of irrigant solution. All Gelfoam were then removed. No significant active bleeding present at the time of removal. All instruments sponge counts were correct traction system was then carefully removed with bipolar electrocautery of any small bleeders. Lumbodorsal fascia was then carefully approximated with interrupted 0 Vicryl sutures, UR 6 needle deep subcutaneous layers were approximated with interrupted 0 Vicryl sutures on UR 6 the appear subcutaneous layers approximated with interrupted 2-0 Vicryl sutures and the skin closed with a running subcutaneous stitch of 4-0 Vicryl. Dermabond was applied allowed to dry and then Mepilex bandage applied. Patient was then carefully returned to supine position on a stretcher, reactivated and extubated. He was then returned to recovery room in satisfactory condition. Benjiman Core PA-C perform the duties of assistant surgeon during this case. he was present from the beginning  of the case to the end of the case assisting in transfer the patient from his stretcher to the OR table and back to the stretcher at the end of the case. Assisted in careful retraction and suction of the laminectomy site delicate neural structures operating under the operating room microscope. he performed closure of the incision from the fascia to the skin applying the  dressing.   FINDINGS: Severe bilateral lateral recess stenosis L4-5 with compression of bilateral L5 nerve roots, mild L4 neuroforamenal stenosis due to calcified central protruded disc L4-5 with large spur off the inferior posterior L4 lip.    Obie Silos E  08/13/2015, 3:53 PM

## 2015-08-13 NOTE — Progress Notes (Signed)
Pt arrived on the unit at 18:45. Mother and sister at bedside.  Alert and oriented. No complaints of pain or discomfort at this time. Taking oral fluids in without difficulty.  No c/o nausea. Advanced to regular diet.  Awaiting dinner tray from dining services. Will continue to monitor.

## 2015-08-14 ENCOUNTER — Encounter (HOSPITAL_COMMUNITY): Payer: Self-pay | Admitting: Specialist

## 2015-08-14 DIAGNOSIS — M5126 Other intervertebral disc displacement, lumbar region: Secondary | ICD-10-CM | POA: Diagnosis not present

## 2015-08-14 MED ORDER — DM-GUAIFENESIN ER 30-600 MG PO TB12
1.0000 | ORAL_TABLET | Freq: Two times a day (BID) | ORAL | Status: DC
  Administered 2015-08-14 – 2015-08-16 (×5): 1 via ORAL
  Filled 2015-08-14 (×6): qty 1

## 2015-08-14 MED ORDER — OXYCODONE HCL 5 MG PO TABS
15.0000 mg | ORAL_TABLET | ORAL | Status: DC | PRN
  Administered 2015-08-14 – 2015-08-16 (×12): 15 mg via ORAL
  Filled 2015-08-14 (×12): qty 3

## 2015-08-14 MED ORDER — KETOROLAC TROMETHAMINE 30 MG/ML IJ SOLN
30.0000 mg | Freq: Three times a day (TID) | INTRAMUSCULAR | Status: AC | PRN
  Administered 2015-08-14: 30 mg via INTRAVENOUS
  Filled 2015-08-14: qty 1

## 2015-08-14 MED ORDER — MORPHINE SULFATE ER 15 MG PO TBCR
15.0000 mg | EXTENDED_RELEASE_TABLET | Freq: Two times a day (BID) | ORAL | Status: DC
  Administered 2015-08-14 (×2): 15 mg via ORAL
  Filled 2015-08-14 (×2): qty 1

## 2015-08-14 MED FILL — Thrombin For Soln 20000 Unit: CUTANEOUS | Qty: 1 | Status: AC

## 2015-08-14 NOTE — Progress Notes (Signed)
     Subjective: 1 Day Post-Op Procedure(s) (LRB): BILATERAL LATERAL RECESSED DECOMPRESSION L4 - L5 (N/A) C/O pain that medication does not last long. PT and OT to see this AM.  Patient reports pain as marked.    Objective:   VITALS:  Temp:  [98 F (36.7 C)-99.1 F (37.3 C)] 98.3 F (36.8 C) (02/01 0601) Pulse Rate:  [56-96] 56 (02/01 0601) Resp:  [10-22] 18 (02/01 0601) BP: (114-166)/(51-103) 114/51 mmHg (02/01 0601) SpO2:  [93 %-100 %] 93 % (02/01 0601) Weight:  [238 lb (107.956 kg)] 238 lb (107.956 kg) (01/31 1149)  Neurologically intact ABD soft Neurovascular intact Sensation intact distally Intact pulses distally Dorsiflexion/Plantar flexion intact Incision: no drainage   LABS  Recent Labs  08/12/15 0840  HGB 14.8  WBC 7.3  PLT 222    Recent Labs  08/12/15 0840  NA 142  K 4.2  CL 108  CO2 24  BUN 10  CREATININE 1.13  GLUCOSE 98   No results for input(s): LABPT, INR in the last 72 hours.   Assessment/Plan: 1 Day Post-Op Procedure(s) (LRB): BILATERAL LATERAL RECESSED DECOMPRESSION L4 - L5 (N/A)  Advance diet Up with therapy  Will increase narcotics to oxycodone 10-15 mg po every 4-6 hours. Recheck in afternoon post therapy for considering discharge.  NITKA,JAMES E 08/14/2015, 8:18 AM

## 2015-08-14 NOTE — Evaluation (Signed)
Physical Therapy Evaluation Patient Details Name: Kerry Wiley MRN: [id number] DOB: 09-Sep-1975 Today's Date: 08/14/2015   History of Present Illness  Pt presents for L4-5 decompression due to pain in LE's down to foot from several years old work injury. Also has h/o knee and ankle surgery.  Clinical Impression  Pt admitted with above diagnosis. Pt currently with functional limitations due to the deficits listed below (see PT Problem List). Pt ambulated 2' with RW and min A. Pt's mother had syncopal episode in room during session which significantly increased pt's stress. See details below.  Pt will benefit from skilled PT to increase their independence and safety with mobility to allow discharge to the venue listed below.       Follow Up Recommendations Home health PT;Supervision for mobility/OOB    Equipment Recommendations  Rolling walker with 5" wheels    Recommendations for Other Services OT consult     Precautions / Restrictions Precautions Precautions: Back Precaution Booklet Issued: Yes (comment) Precaution Comments: reviewed precautions and proper positioning Restrictions Weight Bearing Restrictions: No      Mobility  Bed Mobility Overal bed mobility: Needs Assistance Bed Mobility: Supine to Sit;Sit to Supine     Supine to sit: Mod assist Sit to supine: Min assist   General bed mobility comments: mod A for legs off bed and elevation of trunk due to back pain. Min A for return to SL and then rolling onto back and repositioning  Transfers Overall transfer level: Needs assistance Equipment used: Rolling walker (2 wheeled) Transfers: Sit to/from Stand Sit to Stand: Min assist         General transfer comment: vc's for hand palcement, min A to steady  Ambulation/Gait Ambulation/Gait assistance: Min assist Ambulation Distance (Feet): 60 Feet Assistive device: Rolling walker (2 wheeled) Gait Pattern/deviations: Antalgic;Decreased stride length Gait velocity:  decreased Gait velocity interpretation: Below normal speed for 40/gender General Gait Details: slow, guarded gait   Stairs            Wheelchair Mobility    Modified Rankin (Stroke Patients Only)       Balance Overall balance assessment: Needs assistance Sitting-balance support: Single extremity supported;Feet supported Sitting balance-Leahy Scale: Fair     Standing balance support: No upper extremity supported Standing balance-Leahy Scale: Fair                               Pertinent Vitals/Pain Pain Assessment: Faces Faces Pain Scale: Hurts whole lot Pain Location: back at incision Pain Descriptors / Indicators: Burning Pain Intervention(s): Limited activity within patient's tolerance;Monitored during session;Premedicated before session    Home Living Family/patient expects to be discharged to:: Private residence Living Arrangements: Spouse/significant other;Children Available Help at Discharge: Family;Available PRN/intermittently Type of Home: House Home Access: Stairs to enter   [employer] of Steps: 2 Home Layout: One level Home Equipment: None Additional Comments: lives with girlfriend and 7 yo son. Has had trauma in his life lately, 18 yo daughter passed away last year and father has Alzheimers and lives in a facility because his mother has to work. Mother present on eval but she became dizzy and not feeling well and then passed out bkwds. PT caught her and lowered her onto foot of pt's bed (he was sitting at head) and RN's arrived. Pt stressed about this afterwards. Sister arrived to assist with mother.      Prior Function Level of Independence: Independent  Comments: unable to work due to back pain, did work in Museum/gallery curator        Extremity/Trunk Assessment   Upper Extremity Assessment: Defer to OT evaluation           Lower Extremity Assessment: Overall WFL for tasks assessed       Cervical / Trunk Assessment: Normal  Communication   Communication: No difficulties  Cognition Arousal/Alertness: Awake/alert Behavior During Therapy: WFL for tasks assessed/performed Overall Cognitive Status: Within Functional Limits for tasks assessed                      General Comments General comments (skin integrity, edema, etc.): pt handled episode with his mother well but once she was taken out of room and he was supine again, pain was significantly increased from event, RN notified    Exercises        Assessment/Plan    PT Assessment Patient needs continued PT services  PT Diagnosis Difficulty walking;Acute pain;Abnormality of gait   PT Problem List Decreased activity tolerance;Decreased balance;Decreased mobility;Decreased knowledge of use of DME;Decreased knowledge of precautions;Pain  PT Treatment Interventions DME instruction;Gait training;Stair training;Functional mobility training;Therapeutic activities;Therapeutic exercise;Balance training;Patient/family education   PT Goals (Current goals can be found in the Care Plan section) Acute Rehab PT Goals Patient Stated Goal: return home, be able to work again PT Goal Formulation: With patient Time For Goal Achievement: 08/21/15 Potential to Achieve Goals: Good    Frequency Min 5X/week   Barriers to discharge        Co-evaluation               End of Session Equipment Utilized During Treatment: Gait belt Activity Tolerance: Patient tolerated treatment well Patient left: in bed;with call bell/phone within reach Nurse Communication: Mobility status    Functional Assessment Tool Used: clinical judgement Functional Limitation: Mobility: Walking and moving around Mobility: Walking and Moving Around Current Status [id number]): At least 20 percent but less than 40 percent impaired, limited or restricted Mobility: Walking and Moving Around Goal Status [phone]): At least 1 percent but less than 20 percent  impaired, limited or restricted    Time: 0957-1030 PT Time Calculation (min) (ACUTE ONLY): 33 min   Charges:   PT Evaluation $PT Eval Moderate Complexity: 1 Procedure PT Treatments $Gait Training: 8-22 mins   PT G Codes:   PT G-Codes **NOT FOR INPATIENT CLASS** Functional Assessment Tool Used: clinical judgement Functional Limitation: Mobility: Walking and moving around Mobility: Walking and Moving Around Current Status [id number]): At least 20 percent but less than 40 percent impaired, limited or restricted Mobility: Walking and Moving Around Goal Status [id number]): At least 1 percent but less than 20 percent impaired, limited or restricted   Leighton Roach, PT  Acute Rehab Services  [phone]  Leighton Roach 08/14/2015, 1:46 PM

## 2015-08-14 NOTE — Progress Notes (Signed)
Occupational Therapy Evaluation Patient Details Name: Kerry Wiley MRN: [id number] DOB: 01/11/1976 Today's Date: 08/14/2015    History of Present Illness Pt presents for L4-5 decompression due to pain in LE's down to foot from several years old work injury. Also has h/o knee and ankle surgery.   Clinical Impression   PTA, pt independent with ADL and mobility. Pt making more progress this session as compared to earlier PT session. Began education regarding ADL and back precautions. Pt ambulated @ 150 ft @ RW level. Will follow up tomorrow to complete education. Pt will be safe to D/C tomorrow after therapy from OT standpoint if medically stable.     Follow Up Recommendations  No OT follow up;Supervision - Intermittent    Equipment Recommendations  3 in 1 bedside comode    Recommendations for Other Services       Precautions / Restrictions Precautions Precautions: Back Precaution Booklet Issued: Yes (comment) Precaution Comments: reviewed precautions and proper positioning Restrictions Weight Bearing Restrictions: No      Mobility Bed Mobility Overal bed mobility: Needs Assistance Bed Mobility: Sit to Supine     Supine to sit: Supervision    General bed mobility comments:  Transfers Overall transfer level: Needs assistance Equipment used: Rolling walker (2 wheeled) Transfers: Sit to/from Stand Sit to Stand: Supervision         General transfer comment: c/o being dizzy intially    Balance Overall balance assessment: Needs assistance Sitting-balance support: Single extremity supported;Feet supported Sitting balance-Leahy Scale: Fair     Standing balance support: No upper extremity supported Standing balance-Leahy Scale: Fair                              ADL Overall ADL's : Needs assistance/impaired     Grooming: Supervision/safety;Set up   Upper Body Bathing: Supervision/ safety;Set up;Sitting   Lower Body Bathing: Minimal assistance;Sit  to/from stand   Upper Body Dressing : Supervision/safety;Set up;Sitting   Lower Body Dressing: Minimal assistance;Sit to/from stand   Toilet Transfer: Min guard;RW   Toileting- Water quality scientist and Hygiene: Min guard;Sit to/from stand       Functional mobility during ADLs: Min guard;Rolling walker (walked last 10 feet wtihout a RW) General ADL Comments: Began education on compensatory techniques. Will educate on AE if needed. Discussed bathroom set up. Pt will benefit from 3 in 1 intitially.      Vision     Perception     Praxis      Pertinent Vitals/Pain Pain Assessment: 0-10 Pain Score: 5  Faces Pain Scale: Hurts whole lot Pain Location: back Pain Descriptors / Indicators: Burning Pain Intervention(s): Limited activity within patient's tolerance;Repositioned;Ice applied     Hand Dominance     Extremity/Trunk Assessment Upper Extremity Assessment Upper Extremity Assessment: Overall WFL for tasks assessed   Lower Extremity Assessment Lower Extremity Assessment: Overall WFL for tasks assessed   Cervical / Trunk Assessment Cervical / Trunk Assessment: Normal   Communication Communication Communication: No difficulties   Cognition Arousal/Alertness: Awake/alert Behavior During Therapy: WFL for tasks assessed/performed Overall Cognitive Status: Within Functional Limits for tasks assessed                     General Comments       Exercises       Shoulder Instructions      Home Living Family/patient expects to be discharged to:: Private residence Living Arrangements: Spouse/significant other;Children Available Help at  Discharge: Family;Available PRN/intermittently Type of Home: House Home Access: Stairs to enter [employer] of Steps: 2   Home Layout: One level     Bathroom Shower/Tub: Tub/shower unit Shower/tub characteristics: Architectural technologist: Standard Bathroom Accessibility: Yes How Accessible: Accessible via  walker Home Equipment: None   Additional Comments: lives with girlfriend and 18 yo son. Has had trauma in his life lately, 18 yo daughter passed away last year and father has Alzheimers and lives in a facility because his mother has to work. Mother present on eval but she became dizzy and not feeling well and then passed out bkwds. PT caught her and lowered her onto foot of pt's bed (he was sitting at head) and RN's arrived. Pt stressed about this afterwards. Sister arrived to assist with mother.        Prior Functioning/Environment Level of Independence: Independent        Comments: unable to work due to back pain, did work in Statistic[employer]    OT Diagnosis: Generalized weakness;Acute pain   OT Problem List: Decreased strength;Decreased activity tolerance;Decreased safety awareness;Decreased knowledge of use of DME or AE;Decreased knowledge of precautions;Pain   OT Treatment/Interventions: Self-care/ADL training;DME and/or AE instruction;Therapeutic activities;Patient/family education    OT Goals(Current goals can be found in the care plan section) Acute Rehab OT Goals Patient Stated Goal: return home, be able to work again OT Goal Formulation: With patient Time For Goal Achievement: 08/21/15 Potential to Achieve Goals: Good ADL Goals Pt Will Perform Lower Body Bathing: with supervision;with caregiver independent in assisting;sit to/from stand;with adaptive equipment Pt Will Perform Lower Body Dressing: with supervision;with adaptive equipment;with caregiver independent in assisting;sit to/from stand Pt Will Transfer to Toilet: with modified independence;ambulating;bedside commode Pt Will Perform Toileting - Clothing Manipulation and hygiene: with modified independence;sit to/from stand Additional ADL Goal #1: Pt will independently verbalize 3/3 back precautions  OT Frequency: Min 2X/week   Barriers to D/C:            Co-evaluation              End of Session Equipment  Utilized During Treatment: Gait belt;Rolling walker Nurse Communication: Mobility status  Activity Tolerance: Patient tolerated treatment well (Bp changes limited session) Patient left: in bed;with call bell/phone within reach;with family/visitor present   Time: [id number] OT Time Calculation (min): 19 min Charges:  OT General Charges $OT Visit: 1 Procedure OT Evaluation $OT Eval Low Complexity: 1 Procedure G-Codes: OT G-codes **NOT FOR INPATIENT CLASS** Functional Assessment Tool Used: clinical judgement Functional Limitation: Self care Self Care Current Status [id number]): At least 1 percent but less than 20 percent impaired, limited or restricted Self Care Goal Status [id number]): At least 1 percent but less than 20 percent impaired, limited or restricted  Brenen Beigel,HILLARY 08/14/2015, 2:13 PM   [facility], OTR/L  [phone] 08/14/2015

## 2015-08-14 NOTE — Care Management Note (Signed)
Case Management Note  Patient Details  Name: Kerry Wiley MRN: [id number] Date of Birth: March 12, 1976  Subjective/Objective:                 S/p decompression L4-L5   Action/Plan: PT recommended HHPT, spoke with patient and explained that HHPT is not covered by his insurance for his diagnosis. Patient did not wish to pay out of pocket . Contacted James at [facility] and requested rolling walker and 3N1 be delivered to patient's room.Patient stated that his girlfriend and son will be able to assist him after discharge. Therapist gave patient home exercises.            Expected Discharge Date:                  Expected Discharge Plan:  Home/Self Care  In-House Referral:  NA  Discharge planning Services  CM Consult  Post Acute Care Choice:  Durable Medical Equipment Choice offered to:     DME Arranged:  3-N-1, Walker rolling DME Agency:  [place]:  NA [place] Agency:     Status of Service:  Completed, signed off  Medicare Important Message Given:    Date Medicare IM Given:    Medicare IM give by:    Date Additional Medicare IM Given:    Additional Medicare Important Message give by:     If discussed at [place] of Stay Meetings, dates discussed:    Additional Comments:  Nila Nephew, RN 08/14/2015, 2:03 PM

## 2015-08-15 ENCOUNTER — Encounter (HOSPITAL_COMMUNITY): Payer: Self-pay | Admitting: General Practice

## 2015-08-15 ENCOUNTER — Ambulatory Visit (HOSPITAL_COMMUNITY): Payer: Medicaid Other

## 2015-08-15 DIAGNOSIS — M5126 Other intervertebral disc displacement, lumbar region: Secondary | ICD-10-CM | POA: Diagnosis not present

## 2015-08-15 MED ORDER — OXYCODONE-ACETAMINOPHEN 10-325 MG PO TABS: 1.0000 | ORAL_TABLET | ORAL | Status: DC | PRN

## 2015-08-15 MED ORDER — MORPHINE SULFATE ER 30 MG PO TBCR
30.0000 mg | EXTENDED_RELEASE_TABLET | Freq: Two times a day (BID) | ORAL | Status: DC
  Administered 2015-08-15 – 2015-08-16 (×3): 30 mg via ORAL
  Filled 2015-08-15 (×3): qty 1

## 2015-08-15 MED ORDER — METHOCARBAMOL 500 MG PO TABS: 500.0000 mg | ORAL_TABLET | Freq: Four times a day (QID) | ORAL | Status: DC | PRN

## 2015-08-15 MED ORDER — DOCUSATE SODIUM 100 MG PO CAPS: 100.0000 mg | ORAL_CAPSULE | Freq: Two times a day (BID) | ORAL | Status: AC

## 2015-08-15 MED ORDER — MORPHINE SULFATE ER 30 MG PO TBCR: 30.0000 mg | EXTENDED_RELEASE_TABLET | Freq: Two times a day (BID) | ORAL | Status: AC

## 2015-08-15 NOTE — Progress Notes (Signed)
Physical Therapy Treatment Patient Details Name: Kerry Wiley MRN: [id number] DOB: 06/30/1976 Today's Date: 08/15/2015    History of Present Illness 40 y.o. male presents for L4-5 decompression on 08/13/15 due to pain in LE's down to foot from several years old work injury. Also has h/o knee and ankle surgery.    PT Comments    Pt is progressing well with his mobility. He is able to walk the entire unit with supervision only and is starting to attempt some gait without RW (switiching back and forth).  He reports that his pain level really has an impact on his mobility at this time.  He reports walking the unit 4 times yesterday.  He is doing well with compliance of back precautions and was able to report back to therapist when asked 3/3.  PT will continue to follow acutely and plans to practice stairs prior to possible d/c home tomorrow.   Follow Up Recommendations  No PT follow up;Supervision - Intermittent     Equipment Recommendations  Rolling walker with 5" wheels    Recommendations for Other Services   NA     Precautions / Restrictions Precautions Precautions: Back Precaution Comments: Pt able to recall 3/3 back precautions Restrictions Weight Bearing Restrictions: No    Mobility  Bed Mobility Overal bed mobility: Needs Assistance Bed Mobility: Rolling;Sidelying to Sit;Sit to Sidelying Rolling: Supervision Sidelying to sit: Supervision     Sit to sidelying: Supervision General bed mobility comments: HOB flattened, no railing, elevated to simulate bed at home. Pt exited his right side as he would at home as well.  Supervision for safety and verbal cues for correct log roll and reverse log roll techniques.   Transfers Overall transfer level: Needs assistance Equipment used: Rolling walker (2 wheeled) Transfers: Sit to/from Stand Sit to Stand: Supervision         General transfer comment: supervision for safety during transitions due to heavy reliance on hands.   Verbal cues for safe hand placement as pt wants to push up with both hands on RW.   Ambulation/Gait Ambulation/Gait assistance: Supervision Ambulation Distance (Feet): 510 Feet Assistive device: Rolling walker (2 wheeled);None Gait Pattern/deviations: Step-through pattern Gait velocity: decreased Gait velocity interpretation: Below normal speed for age/gender General Gait Details: Pt with slow, guarded gait pattern.  At times using RW and at times attempting to try to walk without RW (gait speed and stride length significantly slower without, yet posture better).  Verbal cues for upright posture throughout gait.            Balance Overall balance assessment: Needs assistance Sitting-balance support: Feet supported;No upper extremity supported Sitting balance-Leahy Scale: Good     Standing balance support: Bilateral upper extremity supported;No upper extremity supported Standing balance-Leahy Scale: Fair                      Cognition Arousal/Alertness: Awake/alert Behavior During Therapy: WFL for tasks assessed/performed Overall Cognitive Status: Within Functional Limits for tasks assessed                         General Comments General comments (skin integrity, edema, etc.): Educated pt on importance of upright posture during gait and while sitting, decreased sitting time 30-45 mins max, no lifting, and main form of exercise he should be doing is walking.        Pertinent Vitals/Pain Pain Assessment: 0-10 Pain Score: 7  Pain Location: incision Pain Descriptors / Indicators: Burning;Aching  Pain Intervention(s): Limited activity within patient's tolerance;Monitored during session;Repositioned;Patient requesting pain meds-RN notified;RN gave pain meds during session           PT Goals (current goals can now be found in the care plan section) Acute Rehab PT Goals Patient Stated Goal: return home, be able to work again Progress towards PT goals:  Progressing toward goals    Frequency  Min 5X/week    PT Plan Discharge plan needs to be updated       End of Session   Activity Tolerance: Patient tolerated treatment well Patient left: in chair;with call bell/phone within reach     Time: [id number] PT Time Calculation (min) (ACUTE ONLY): 43 min  Charges:  $Gait Training: 8-22 mins $Therapeutic Activity: 8-22 mins $Self Care/Home Management: 8-22                      Waylon Hershey B. [place], [place], DPT [phone]   08/15/2015, 3:24 PM

## 2015-08-15 NOTE — Progress Notes (Signed)
Complains of productive cough, using mucinex, history of bronchitis in the past. Taking narcotics for post op pain with episoditic lethargy.  Check CXR. PT to work with stairs in the AM. Assess CXR, if no acute changes and does well with PT Tomorrow then D/C.

## 2015-08-15 NOTE — Progress Notes (Signed)
Occupational Therapy Treatment Patient Details Name: Kerry Wiley MRN: [id number] DOB: 1976-06-28 Today's Date: 08/15/2015    History of present illness Pt presents for L4-5 decompression due to pain in LE's down to foot from several years old work injury. Also has h/o knee and ankle surgery.   OT comments  Pt progressing well towards occupational therapy goals. Pt's pain and lethargy limited today's session. Educated and practiced with AE for LB ADLs, reviewed back precautions, positioning for sleep, log roll technique, and pain/edema management strategies for home. Will continue to follow acutely to address OT needs and goals.    Follow Up Recommendations  No OT follow up;Supervision - Intermittent    Equipment Recommendations  3 in 1 bedside comode    Recommendations for Other Services      Precautions / Restrictions Precautions Precautions: Back Precaution Comments: Pt able to recall ALL back precautions Restrictions Weight Bearing Restrictions: No       Mobility Bed Mobility Overal bed mobility: Needs Assistance Bed Mobility: Sidelying to Sit;Rolling;Sit to Sidelying Rolling: Supervision Sidelying to sit: Supervision     Sit to sidelying: Supervision General bed mobility comments: Supervision for safety. HOB slightly elevated, use of bedrails. Good demonstration of log roll technique  Transfers                 General transfer comment: Pt declined practicing transfers this session due to increased pain and feeling lethargic from medications.    Balance Overall balance assessment: Needs assistance Sitting-balance support: No upper extremity supported;Feet supported Sitting balance-Leahy Scale: Fair                             ADL Overall ADL's : Needs assistance/impaired         Upper Body Bathing: Supervision/ safety;Sitting   Lower Body Bathing: Supervison/ safety;With adaptive equipment;Cueing for compensatory  techniques;Sitting/lateral leans   Upper Body Dressing : Supervision/safety;Sitting   Lower Body Dressing: Supervision/safety;With adaptive equipment;Cueing for compensatory techniques;Sitting/lateral leans                 General ADL Comments: Educated and provided with AE for LB ADLs due to pt being unable to move RLE (past reconstructive surgery on knee)..      Vision                     Perception     Praxis      Cognition   Behavior During Therapy: WFL for tasks assessed/performed Overall Cognitive Status: Within Functional Limits for tasks assessed                       Extremity/Trunk Assessment               Exercises     Shoulder Instructions       General Comments      Pertinent Vitals/ Pain       Pain Assessment: 0-10 Pain Score: 7  Pain Location: back Pain Descriptors / Indicators: Sore Pain Intervention(s): Limited activity within patient's tolerance;Monitored during session;Premedicated before session;Repositioned  Home Living                                          Prior Functioning/Environment              Frequency Min 2X/week  Progress Toward Goals  OT Goals(current goals can now be found in the care plan section)     Acute Rehab OT Goals Patient Stated Goal: return home, be able to work again OT Goal Formulation: With patient Time For Goal Achievement: 08/21/15 Potential to Achieve Goals: Good ADL Goals Pt Will Perform Lower Body Bathing: with supervision;with caregiver independent in assisting;sit to/from stand;with adaptive equipment Pt Will Perform Lower Body Dressing: with supervision;with adaptive equipment;with caregiver independent in assisting;sit to/from stand Pt Will Transfer to Toilet: with modified independence;ambulating;bedside commode Pt Will Perform Toileting - Clothing Manipulation and hygiene: with modified independence;sit to/from stand Additional ADL Goal #1:  Pt will independently verbalize 3/3 back precautions  Plan Discharge plan remains appropriate    Co-evaluation                 End of Session     Activity Tolerance Patient tolerated treatment well   Patient Left in bed;with call bell/phone within reach   Nurse Communication Mobility status    Functional Assessment Tool Used: clinical judgement Functional Limitation: Self care Self Care Current Status [id number]): At least 1 percent but less than 20 percent impaired, limited or restricted Self Care Goal Status [id number]): At least 1 percent but less than 20 percent impaired, limited or restricted   Time: [id number] OT Time Calculation (min): 30 min  Charges: OT G-codes **NOT FOR INPATIENT CLASS** Functional Assessment Tool Used: clinical judgement Functional Limitation: Self care Self Care Current Status [id number]): At least 1 percent but less than 20 percent impaired, limited or restricted Self Care Goal Status [id number]): At least 1 percent but less than 20 percent impaired, limited or restricted OT General Charges $OT Visit: 1 Procedure OT Treatments $Self Care/Home Management : 23-37 mins  Redmond Baseman, OTR/L Pager: [phone] 08/15/2015, 2:31 PM

## 2015-08-15 NOTE — Progress Notes (Signed)
     Subjective: 2 Days Post-Op Procedure(s) (LRB): BILATERAL LATERAL RECESSED DECOMPRESSION L4 - L5 (N/A) Pain last night, got up and walked in the hallway reports waiting 2 hours to get  Medication for relief of pain. No BM  Positive flatus.  Patient reports pain as marked.    Objective:   VITALS:  Temp:  [98.4 F (36.9 C)-99.6 F (37.6 C)] 98.6 F (37 C) (02/02 [id number]) Pulse Rate:  [60-75] 60 (02/02 0637) Resp:  [18] 18 (02/02 0637) BP: (129-142)/(61-78) 134/61 mmHg (02/02 0637) SpO2:  [90 %-95 %] 90 % (02/02 [id number])  Neurologically intact ABD soft Neurovascular intact Sensation intact distally Intact pulses distally Dorsiflexion/Plantar flexion intact Incision: no drainage   LABS  Recent Labs  08/12/15 0840  HGB 14.8  WBC 7.3  PLT 222    Recent Labs  08/12/15 0840  NA 142  K 4.2  CL 108  CO2 24  BUN 10  CREATININE 1.13  GLUCOSE 98   No results for input(s): LABPT, INR in the last 72 hours.   Assessment/Plan: 2 Days Post-Op Procedure(s) (LRB): BILATERAL LATERAL RECESSED DECOMPRESSION L4 - L5 (N/A)  [facility] diet Up with therapy Discharge home with home health if we are able to control discomfort and If he does well with PT. Increase baseline MSContin.   Shaunee Mulkern E 08/15/2015, 8:23 AM

## 2015-08-15 NOTE — Progress Notes (Signed)
Patient wanted to go outside and smoke. Spoke with Dr. Louanne Skye and he stated patient can have a patch ordered if he wants it,however; he will not order for patient to go outside and smoke when he has a CXR ordered due to complaints of shortness of breath. Patient made aware.

## 2015-08-16 DIAGNOSIS — J069 Acute upper respiratory infection, unspecified: Secondary | ICD-10-CM | POA: Clinically undetermined

## 2015-08-16 DIAGNOSIS — B9789 Other viral agents as the cause of diseases classified elsewhere: Secondary | ICD-10-CM

## 2015-08-16 DIAGNOSIS — M5126 Other intervertebral disc displacement, lumbar region: Secondary | ICD-10-CM | POA: Diagnosis not present

## 2015-08-16 MED ORDER — DIPHENHYDRAMINE HCL 25 MG PO TABS: 25.0000 mg | ORAL_TABLET | Freq: Four times a day (QID) | ORAL | Status: AC | PRN

## 2015-08-16 MED ORDER — DIPHENHYDRAMINE HCL 12.5 MG/5ML PO LIQD
25.0000 mg | Freq: Four times a day (QID) | ORAL | Status: DC | PRN
  Filled 2015-08-16: qty 10

## 2015-08-16 MED ORDER — DM-GUAIFENESIN ER 30-600 MG PO TB12: 1.0000 | ORAL_TABLET | Freq: Two times a day (BID) | ORAL | Status: AC

## 2015-08-16 NOTE — Progress Notes (Signed)
Discharge instruction given to patient and all question answered. Patient received extra dressing to take home and he received his paper prescription. Patient is ready to discharge.

## 2015-08-16 NOTE — Progress Notes (Addendum)
     Subjective: 3 Days Post-Op Procedure(s) (LRB): BILATERAL LATERAL RECESSED DECOMPRESSION L4 - L5 (N/A) Awake alert and oriented x 4. Flatus. Walking better, pain in legs improved. Has itching and light rash over back and legs. Patient reports pain as mild.    Objective:   VITALS:  Temp:  [98 F (36.7 C)-98.4 F (36.9 C)] 98.4 F (36.9 C) (02/03 0504) Pulse Rate:  [62-73] 67 (02/03 0504) Resp:  [16-18] 16 (02/03 0504) BP: (136-156)/(61-79) 136/73 mmHg (02/03 0504) SpO2:  [93 %-94 %] 93 % (02/03 0504)  Neurologically intact ABD soft Neurovascular intact Sensation intact distally Intact pulses distally Dorsiflexion/Plantar flexion intact Incision: dressing C/D/I and no drainage   LABS No results for input(s): HGB, WBC, PLT in the last 72 hours. No results for input(s): NA, K, CL, CO2, BUN, CREATININE, GLUCOSE in the last 72 hours. No results for input(s): LABPT, INR in the last 72 hours.   Assessment/Plan: 3 Days Post-Op Procedure(s) (LRB): BILATERAL LATERAL RECESSED DECOMPRESSION L4 - L5 (N/A) CXR negative for acute pulmonary disease.Likely a viral URI.  Up with therapy Discharge home with home health Benadryl for itching, likely due to detergent on bedding and gown. Kelsay Haggard E 08/16/2015, 8:26 AM

## 2015-08-16 NOTE — Progress Notes (Signed)
Physical Therapy Treatment Patient Details Name: Kerry Wiley MRN: [id number] DOB: 08/08/1975 Today's Date: 08/16/2015    History of Present Illness 40 y.o. male presents for L4-5 decompression on 08/13/15 due to pain in LE's down to foot from several years old work injury. Also has h/o knee and ankle surgery.    PT Comments    Pt is progressing well with his mobility.  Gait speed is improving and he can go without RW for short distances as a trial, pain and stiffness allowing.  Pt was able to report back all precautions and education completed.  He showed proficiency on stairs.  Pt scheduled to d/c today, but PT to follow acutely until this is confirmed.    Follow Up Recommendations  No PT follow up;Supervision - Intermittent     Equipment Recommendations  Rolling walker with 5" wheels    Recommendations for Other Services   NA     Precautions / Restrictions Precautions Precautions: Back Precaution Booklet Issued: Yes (comment) Precaution Comments: Pt recalled all back education when asked.      Mobility  Bed Mobility               General bed mobility comments: Pt able to verbally report correct log roll technique.  He was OOB already, so did not physically practice.   Transfers Overall transfer level: Needs assistance Equipment used: Rolling walker (2 wheeled) Transfers: Sit to/from Stand Sit to Stand: Modified independent (Device/Increase time)         General transfer comment: Pt needed verbal cues to push up from bed, not pull on RW.   Ambulation/Gait Ambulation/Gait assistance: Modified independent (Device/Increase time) Ambulation Distance (Feet): 300 Feet Assistive device: Rolling walker (2 wheeled);None Gait Pattern/deviations: Step-through pattern Gait velocity: decreased Gait velocity interpretation: Below normal speed for age/gender General Gait Details: Pt with better gait speed today, still slower with trials without RW.  Verbal cues for upright  posture.     Stairs Stairs: Yes Stairs assistance: Modified independent (Device/Increase time) Stair Management: No rails Number of Stairs: 5 General stair comments: Pt was able to preform stairs reciprocally, but at a slower speed than would be usual for his age.  He is cautious to be safe.       Balance Overall balance assessment: Needs assistance Sitting-balance support: Feet supported;No upper extremity supported Sitting balance-Leahy Scale: Good     Standing balance support: Single extremity supported;Bilateral upper extremity supported;No upper extremity supported Standing balance-Leahy Scale: Good                      Cognition Arousal/Alertness: Awake/alert Behavior During Therapy: WFL for tasks assessed/performed Overall Cognitive Status: Within Functional Limits for tasks assessed                         General Comments General comments (skin integrity, edema, etc.): Pt is wanting to make his bed elevated like the [facility] in the hospital.  He cannot currently tolerate being flat.  I warned him that he can do this at first, but he will need to progress to being flat again as we do not want his back and surrounding musculature to tighten up and make it difficult to straighten.        Pertinent Vitals/Pain Pain Assessment: 0-10 Pain Score: 6  Pain Location: incisional Pain Descriptors / Indicators: Aching;Burning Pain Intervention(s): Limited activity within patient's tolerance;Monitored during session;Repositioned  PT Goals (current goals can now be found in the care plan section) Acute Rehab PT Goals Patient Stated Goal: to go home, work again Progress towards PT goals: Progressing toward goals    Frequency  Min 5X/week    PT Plan Current plan remains appropriate       End of Session   Activity Tolerance: Patient tolerated treatment well Patient left: in bed;Other (comment) (seated EOB)     Time: [id number] PT Time  Calculation (min) (ACUTE ONLY): 11 min  Charges: 1 gait                  G Codes:  Functional Assessment Tool Used: assist level Functional Limitation: Mobility: Walking and moving around Mobility: Walking and Moving Around Current Status [id number]): 0 percent impaired, limited or restricted Mobility: Walking and Moving Around Goal Status [id number]): At least 1 percent but less than 20 percent impaired, limited or restricted Mobility: Walking and Moving Around Discharge Status [phone]): 0 percent impaired, limited or restricted   Danilyn Cocke B. [place], [place], DPT [phone]   08/16/2015, 2:59 PM

## 2016-03-15 ENCOUNTER — Emergency Department (HOSPITAL_BASED_OUTPATIENT_CLINIC_OR_DEPARTMENT_OTHER): Payer: Medicaid Other

## 2016-03-15 ENCOUNTER — Encounter (HOSPITAL_BASED_OUTPATIENT_CLINIC_OR_DEPARTMENT_OTHER): Payer: Self-pay | Admitting: Emergency Medicine

## 2016-03-15 ENCOUNTER — Emergency Department (HOSPITAL_BASED_OUTPATIENT_CLINIC_OR_DEPARTMENT_OTHER)
Admission: EM | Admit: 2016-03-15 | Discharge: 2016-03-16 | Disposition: A | Discharge status: Home / Self Care | Payer: Medicaid Other | Attending: Emergency Medicine | Admitting: Emergency Medicine

## 2016-03-15 DIAGNOSIS — Y999 Unspecified external cause status: Secondary | ICD-10-CM | POA: Diagnosis not present

## 2016-03-15 DIAGNOSIS — S6721XA Crushing injury of right hand, initial encounter: Secondary | ICD-10-CM

## 2016-03-15 DIAGNOSIS — S61411A Laceration without foreign body of right hand, initial encounter: Secondary | ICD-10-CM

## 2016-03-15 DIAGNOSIS — F17210 Nicotine dependence, cigarettes, uncomplicated: Secondary | ICD-10-CM | POA: Insufficient documentation

## 2016-03-15 DIAGNOSIS — Z85528 Personal history of other malignant neoplasm of kidney: Secondary | ICD-10-CM | POA: Diagnosis not present

## 2016-03-15 DIAGNOSIS — Y939 Activity, unspecified: Secondary | ICD-10-CM | POA: Diagnosis not present

## 2016-03-15 DIAGNOSIS — W52XXXA Crushed, pushed or stepped on by crowd or human stampede, initial encounter: Secondary | ICD-10-CM | POA: Diagnosis not present

## 2016-03-15 DIAGNOSIS — M549 Dorsalgia, unspecified: Secondary | ICD-10-CM

## 2016-03-15 DIAGNOSIS — Y929 Unspecified place or not applicable: Secondary | ICD-10-CM | POA: Diagnosis not present

## 2016-03-15 DIAGNOSIS — S6991XA Unspecified injury of right wrist, hand and finger(s), initial encounter: Secondary | ICD-10-CM | POA: Diagnosis present

## 2016-03-15 DIAGNOSIS — G8929 Other chronic pain: Secondary | ICD-10-CM

## 2016-03-15 NOTE — ED Triage Notes (Signed)
Pt had metal door close on right hand

## 2016-03-15 NOTE — ED Provider Notes (Addendum)
[facility] DEPT [facility] Provider Note   CSN: [id number] Arrival date & time: 03/15/16  2115 By signing my name below, I, Kerry Wiley, attest that this documentation has been prepared under the direction and in the presence of Kerry Rosser, MD. Electronically Signed: Georgette Wiley, ED Scribe. 03/15/16. 11:51 PM.  History   Chief Complaint Chief Complaint  Patient presents with  . Hand Injury   HPI Comments: Kerry Wiley is a 40 y.o. male who presents to the Emergency Department for a laceration to his right hand with pain and swelling s/p mechanical injury today around 8 pm. Per pt, he had a metal door close on his hand. Bleeding was controlled with a bandage. He rates his pain a 10 out of 10, worse with palpation or movement. He has a history of surgery on his right hand for fractures of the right fourth and fifth metacarpals. A review of past records indicates a chronic angulated deformity of the right fifth metacarpal.  Pt is also complaining of An acute exacerbation of chronic back pain. Pt states pain is exacerbated with movement and palpation. Pt also has h/o back surgery. Denies any injury. Pt has taken Ibuprofen and Percocet with inadequate relief of his pain. Pt's Tdap is UTD. Denies fever, numbness, and paresthesia associated with the back pain.  The history is provided by the patient. No language interpreter was used.    Past Medical History:  Diagnosis Date  . Cancer ([place]) 12   cyst? on rt kidney bx done   . Gallstone   . GERD (gastroesophageal reflux disease)    states he has some but not "diagnosed"  . History of kidney stones     Patient Active Problem List   Diagnosis Date Noted  . Viral URI with cough 08/16/2015    Class: Acute  . Spinal stenosis, lumbar region, with neurogenic claudication 08/13/2015    Class: Chronic  . Lumbar disc herniation 08/13/2015    Class: Chronic  . Neurogenic claudication due to lumbar spinal stenosis 08/13/2015  . Suicidal ideation   .  Cocaine abuse   . Fracture metacarpal-open 04/04/2012  . Metacarpal bone fracture 02/17/2012    Past Surgical History:  Procedure Laterality Date  . APPENDECTOMY    . CHOLECYSTECTOMY     2011  . FRACTURE SURGERY Right 95   leg plates, screws  . HAND SURGERY Right 99   metal  . HARDWARE REMOVAL  04/04/2012   Procedure: HARDWARE REMOVAL;  Surgeon: Schuyler Amor, MD;  Location: [facility];  Service: Orthopedics;  Laterality: Right;  right hand hardware removal/tenolysis  . JOINT REPLACEMENT     knee and ankle replacement-1997  . LUMBAR LAMINECTOMY/DECOMPRESSION MICRODISCECTOMY N/A 08/13/2015   Procedure: BILATERAL LATERAL RECESSED DECOMPRESSION L4 - L5;  Surgeon: Jessy Oto, MD;  Location: [facility];  Service: Orthopedics;  Laterality: N/A;       Home Medications    Prior to Admission medications   Medication Sig Start Date End Date Taking? Authorizing Provider  dextromethorphan-guaiFENesin (MUCINEX DM) 30-600 MG 12hr tablet Take 1 tablet by mouth 2 (two) times daily. 08/16/15   Jessy Oto, MD  diphenhydrAMINE (BENADRYL) 25 MG tablet Take 1 tablet (25 mg total) by mouth every 6 (six) hours as needed for itching. 08/16/15   Jessy Oto, MD  docusate sodium (COLACE) 100 MG capsule Take 1 capsule (100 mg total) by mouth 2 (two) times daily. 08/15/15   Jessy Oto, MD  methocarbamol (ROBAXIN) 500  MG tablet Take 1 tablet (500 mg total) by mouth every 6 (six) hours as needed for muscle spasms. 08/15/15   Jessy Oto, MD  morphine (MS CONTIN) 30 MG 12 hr tablet Take 1 tablet (30 mg total) by mouth every 12 (twelve) hours. 08/15/15   Jessy Oto, MD  omeprazole (PRILOSEC) 20 MG capsule Take 20 mg by mouth daily.    Historical Provider, MD  oxyCODONE-acetaminophen (PERCOCET) 10-325 MG tablet Take 1 tablet by mouth every 4 (four) hours as needed for pain. 08/15/15   Jessy Oto, MD    Family History History reviewed. No pertinent family history.  Social History Social  History  Substance Use Topics  . Smoking status: Current Every Day Smoker    Packs/day: 1.00    Types: Cigarettes  . Smokeless tobacco: Never Used  . Alcohol use 1.2 oz/week    2 Shots of liquor per week     Comment: social drinkier     Allergies   Prednisone   Review of Systems Review of Systems 10 Systems reviewed and all are negative for acute change except as noted in the HPI.  Physical Exam Updated Vital Signs BP 132/81 (BP Location: Left Arm)   Pulse 81   Temp 98.4 F (36.9 C) (Oral)   Resp 20   Ht 6\' 2"  (1.88 m)   Wt 240 lb (108.9 kg)   SpO2 97%   BMI 30.81 kg/m   Physical Exam General: Well-developed, well-nourished male in no acute distress; appearance consistent with age of record HENT: normocephalic; atraumatic Eyes: pupils equal, round and reactive to light; extraocular muscles intact Neck: supple Heart: regular rate and rhythm Lungs: clear to auscultation bilaterally Abdomen: soft; nondistended Extremities: No deformity; full range of motion except right fourth and fifth fingers as noted below; pulses normal; tenderness, swelling and laceration of the right hand over the fifth metacarpal; decreased sensation in the right 4th and 5th fingers with inability to extend, flexion intact but weak or poor effort; trace edema of lower legs Neurologic: Awake, alert and oriented; motor function intact in all extremities and symmetric; no facial droop Skin: Warm and dry Psychiatric: Normal mood and affect  ED Treatments / Results  Nursing notes and vitals signs, including pulse oximetry, reviewed.  Summary of this visit's results, reviewed by myself:  Imaging Studies: Dg Hand Complete Right  Result Date: 03/15/2016 CLINICAL DATA:  Caught hand in a metal door, pain and laceration at 5th metacarpal, injury, history of boxer's fracture EXAM: RIGHT HAND - COMPLETE 3+ VIEW COMPARISON:  12/12/2012 FINDINGS: Distal deformity fifth metacarpal from old healed fracture.  Pin tracks within fourth metacarpal with old fracture deformity again seen. Osseous mineralization normal. Joint spaces preserved. Nonstandard positioning. No acute fracture, dislocation, or bone destruction. Soft tissue swelling at the ulnar margin of the RIGHT hand. IMPRESSION: Posttraumatic and deformity of the RIGHT fourth and fifth metacarpals with evidence of prior fourth metacarpal surgery. No acute osseous abnormalities. Electronically Signed   By: Lavonia Dana M.D.   On: 03/15/2016 21:46    Procedures (including critical care time) . LACERATION REPAIR Performed by: Wynetta Fines Authorized by: Wynetta Fines Consent: Verbal consent obtained. Risks and benefits: risks, benefits and alternatives were discussed Consent given by: patient Patient identity confirmed: provided demographic data Prepped and Draped in normal sterile fashion Wound explored, not full-thickness  Laceration Location: Right hand  Laceration Length: 1.4 cm  No Foreign Bodies seen or palpated  Anesthesia: local infiltration  Local anesthetic:  lidocaine 2 % with epinephrine  Anesthetic total: 2 ml  Irrigation method: syringe Amount of cleaning: standard  Skin closure: 4-0 Prolene   Number of sutures: 2   Technique: Simple interrupted   Patient tolerance: Patient tolerated the procedure well with no immediate complications.  Final Clinical Impressions(s) / ED Diagnoses  The patient states his original fractures occurred about 20 years ago and the surgery was done at [facility]. It is unclear how much of the apparent swelling is due to chronic deformity versus acute injury. He states his difficulty extending his right fourth and fifth fingers is a new symptom and we will refer him to hand surgery for further evaluation.  A review of the [place] state controlled substances database indicates the patient has been on multiple prescriptions for oxycodone and hydrocodone in the past year. His last  prescription was 30 Percocets dispensed on August 24 of this year.  Final diagnoses:  Crush injury of hand, right, initial encounter  Laceration of right hand, initial encounter  Exacerbation of chronic back pain   I personally performed the services described in this documentation, which was scribed in my presence. The recorded information has been reviewed and is accurate.    Kerry Rosser, MD 03/16/16 [id number]    Kerry Rosser, MD 03/16/16 [id number]

## 2016-03-16 MED ORDER — OXYCODONE-ACETAMINOPHEN 5-325 MG PO TABS
2.0000 | ORAL_TABLET | Freq: Once | ORAL | Status: AC
  Administered 2016-03-16: 2 via ORAL
  Filled 2016-03-16: qty 2

## 2016-03-16 MED ORDER — LIDOCAINE-EPINEPHRINE 2 %-1:100000 IJ SOLN
20.0000 mL | Freq: Once | INTRAMUSCULAR | Status: AC
  Administered 2016-03-16: 20 mL via INTRADERMAL
  Filled 2016-03-16: qty 1

## 2016-05-04 ENCOUNTER — Encounter (HOSPITAL_COMMUNITY): Payer: Self-pay

## 2016-05-04 ENCOUNTER — Emergency Department (HOSPITAL_COMMUNITY)
Admission: EM | Admit: 2016-05-04 | Discharge: 2016-05-04 | Disposition: A | Discharge status: Home / Self Care | Payer: Medicaid Other | Attending: Emergency Medicine | Admitting: Emergency Medicine

## 2016-05-04 DIAGNOSIS — Z96659 Presence of unspecified artificial knee joint: Secondary | ICD-10-CM | POA: Diagnosis not present

## 2016-05-04 DIAGNOSIS — G8929 Other chronic pain: Secondary | ICD-10-CM | POA: Diagnosis not present

## 2016-05-04 DIAGNOSIS — M545 Low back pain: Secondary | ICD-10-CM | POA: Insufficient documentation

## 2016-05-04 DIAGNOSIS — F17210 Nicotine dependence, cigarettes, uncomplicated: Secondary | ICD-10-CM | POA: Insufficient documentation

## 2016-05-04 DIAGNOSIS — Z96669 Presence of unspecified artificial ankle joint: Secondary | ICD-10-CM | POA: Insufficient documentation

## 2016-05-04 MED ORDER — HYDROCODONE-ACETAMINOPHEN 5-325 MG PO TABS
1.0000 | ORAL_TABLET | Freq: Once | ORAL | Status: AC
  Administered 2016-05-04: 1 via ORAL
  Filled 2016-05-04: qty 1

## 2016-05-04 MED ORDER — NAPROXEN 500 MG PO TABS: 500.0000 mg | ORAL_TABLET | Freq: Two times a day (BID) | ORAL | 0 refills | Status: DC

## 2016-05-04 MED ORDER — METHOCARBAMOL 500 MG PO TABS
500.0000 mg | ORAL_TABLET | Freq: Two times a day (BID) | ORAL | 0 refills | Status: AC | PRN
Start: 2016-05-04 — End: ?

## 2016-05-04 NOTE — ED Triage Notes (Addendum)
Pt reports he has chronic back pain. He had surgery in March and now reports he has pain bilaterally in his hips. He was told he needs to have hips replaced. PT reports he recently went back to work in [employer] d/t needing an income. Pt reports pain has intensified since. Pt ambulated to triage.

## 2016-05-04 NOTE — ED Notes (Signed)
See pa assessment 

## 2016-05-04 NOTE — ED Provider Notes (Signed)
[facility] DEPT Provider Note   CSN: [id number] Arrival date & time: 05/04/16  1722     History   Chief Complaint Chief Complaint  Patient presents with  . Back Pain  . Hip Pain    HPI Decklin Baptist is a 40 y.o. male.  Cairo Sanderfer is a 40 y.o. Male who presents to the ED needing a refill on his pain medications. Patient reports he has lots of chronic pain including pain to his back and his hips. He has had previous back surgeries and tells me he is due to have hip replacement. He reports he is going back to work recently and has been having worsening pain to his back. He tells me normally he gets pain medication from his primary care doctor but he missed his appointment recently. He reports recently he has not been taking anything for treatment of his symptoms. He complains of pain to his bilateral low back and to his bilateral hips. He reports his left hip seems to be hurting him worse. He denies any recent injury or trauma. He denies fevers, numbness, weakness, loss of bladder control, loss of bowel control, history of cancer, history of IV drug use, abdominal pain, nausea, vomiting, urinary symptoms, or rashes    Back Pain   Pertinent negatives include no chest pain, no fever, no numbness, no headaches, no abdominal pain, no dysuria and no weakness.  Hip Pain  Pertinent negatives include no chest pain, no abdominal pain, no headaches and no shortness of breath.    Past Medical History:  Diagnosis Date  . 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 Gallstone   . GERD (gastroesophageal reflux disease)    states he has some but not "diagnosed"  . History of kidney stones     Patient Active Problem List   Diagnosis Date Noted  . Viral URI with cough 08/16/2015    Class: Acute  . Spinal stenosis, lumbar region, with neurogenic claudication 08/13/2015    Class: Chronic  . Lumbar disc herniation 08/13/2015    Class: Chronic  . Neurogenic claudication due to  lumbar spinal stenosis 08/13/2015  . Suicidal ideation   . Cocaine abuse   . Fracture metacarpal-open 04/04/2012  . Metacarpal bone fracture 02/17/2012    Past Surgical History:  Procedure Laterality Date  . APPENDECTOMY    . CHOLECYSTECTOMY     2011  . FRACTURE SURGERY Right 95   leg plates, screws  . HAND SURGERY Right 99   metal  . HARDWARE REMOVAL  04/04/2012   Procedure: HARDWARE REMOVAL;  Surgeon: Schuyler Amor, MD;  Location: [facility];  Service: Orthopedics;  Laterality: Right;  right hand hardware removal/tenolysis  . JOINT REPLACEMENT     knee and ankle replacement-1997  . LUMBAR LAMINECTOMY/DECOMPRESSION MICRODISCECTOMY N/A 08/13/2015   Procedure: BILATERAL LATERAL RECESSED DECOMPRESSION L4 - L5;  Surgeon: Jessy Oto, MD;  Location: [facility];  Service: Orthopedics;  Laterality: N/A;       Home Medications    Prior to Admission medications   Medication Sig Start Date End Date Taking? Authorizing Provider  dextromethorphan-guaiFENesin (MUCINEX DM) 30-600 MG 12hr tablet Take 1 tablet by mouth 2 (two) times daily. 08/16/15   Jessy Oto, MD  diphenhydrAMINE (BENADRYL) 25 MG tablet Take 1 tablet (25 mg total) by mouth every 6 (six) hours as needed for itching. 08/16/15   Jessy Oto, MD  docusate sodium (COLACE) 100 MG capsule  Take 1 capsule (100 mg total) by mouth 2 (two) times daily. 08/15/15   Jessy Oto, MD  methocarbamol (ROBAXIN) 500 MG tablet Take 1 tablet (500 mg total) by mouth 2 (two) times daily as needed for muscle spasms. 05/04/16   Waynetta Pean, PA-C  morphine (MS CONTIN) 30 MG 12 hr tablet Take 1 tablet (30 mg total) by mouth every 12 (twelve) hours. 08/15/15   Jessy Oto, MD  naproxen (NAPROSYN) 500 MG tablet Take 1 tablet (500 mg total) by mouth 2 (two) times daily with a meal. 05/04/16   Waynetta Pean, PA-C  omeprazole (PRILOSEC) 20 MG capsule Take 20 mg by mouth daily.    Historical Provider, MD  oxyCODONE-acetaminophen (PERCOCET)  10-325 MG tablet Take 1 tablet by mouth every 4 (four) hours as needed for pain. 08/15/15   Jessy Oto, MD    Family History History reviewed. No pertinent family history.  Social History Social History  Substance Use Topics  . Smoking status: Current Every Day Smoker    Packs/day: 0.50    Types: Cigarettes  . Smokeless tobacco: Never Used  . Alcohol use 1.2 oz/week    2 Shots of liquor per week     Comment: social drinkier     Allergies   Prednisone   Review of Systems Review of Systems  Constitutional: Negative for fever.  Eyes: Negative for visual disturbance.  Respiratory: Negative for shortness of breath.   Cardiovascular: Negative for chest pain.  Gastrointestinal: Negative for abdominal pain, nausea and vomiting.  Genitourinary: Negative for difficulty urinating, dysuria, frequency and hematuria.  Musculoskeletal: Positive for arthralgias and back pain. Negative for neck pain.  Skin: Negative for rash and wound.  Neurological: Negative for weakness, numbness and headaches.     Physical Exam Updated Vital Signs BP 146/87 (BP Location: Left Arm)   Pulse 80   Temp 98.2 F (36.8 C) (Oral)   Resp 18   SpO2 95%   Physical Exam  Constitutional: He appears well-developed and well-nourished. No distress.  Nontoxic appearing.  HENT:  Head: Normocephalic and atraumatic.  Eyes: Conjunctivae are normal. Pupils are equal, round, and reactive to light. Right eye exhibits no discharge. Left eye exhibits no discharge.  Neck: Neck supple.  Cardiovascular: Normal rate, regular rhythm and intact distal pulses.   Bilateral radial, posterior tibialis and dorsalis pedis pulses are intact.    Pulmonary/Chest: Effort normal and breath sounds normal. No respiratory distress.  Abdominal: Soft. There is no tenderness.  Musculoskeletal: Normal range of motion. He exhibits tenderness. He exhibits no edema or deformity.  Mild tenderness to his bilateral low back musculature. No  midline neck or back tenderness. Normal gait. Good strength to his bilateral lower extremities. Good strength with plantar and dorsiflexion bilaterally. No calf edema or tenderness bilaterally.  Neurological: He is alert. He has normal reflexes. He displays normal reflexes. Coordination normal.  Sensation is intact his bilateral lower extremities. Bilateral patellar DTRs are intact. Normal gait.  Skin: Skin is warm and dry. Capillary refill takes less than 2 seconds. No rash noted. He is not diaphoretic. No erythema. No pallor.  Psychiatric: He has a normal mood and affect. His behavior is normal.  Nursing note and vitals reviewed.    ED Treatments / Results  Labs (all labs ordered are listed, but only abnormal results are displayed) Labs Reviewed - No data to display  EKG  EKG Interpretation None       Radiology No results found.  Procedures  Procedures (including critical care time)  Medications Ordered in ED Medications  HYDROcodone-acetaminophen (NORCO/VICODIN) 5-325 MG per tablet 1 tablet (not administered)     Initial Impression / Assessment and Plan / ED Course  I have reviewed the triage vital signs and the nursing notes.  Pertinent labs & imaging results that were available during my care of the patient were reviewed by me and considered in my medical decision making (see chart for details).  Clinical Course   This is a 40 y.o. Male who presents to the ED needing a refill on his pain medications. Patient reports he has lots of chronic pain including pain to his back and his hips. He has had previous back surgeries and tells me he is due to have hip replacement. He reports he is going back to work recently and has been having worsening pain to his back. He tells me normally he gets pain medication from his primary care doctor but he missed his appointment recently. He reports recently he has not been taking anything for treatment of his symptoms. He complains of pain to  his bilateral low back and to his bilateral hips. He reports his left hip seems to be hurting him worse. He denies any recent injury or trauma.  On exam the patient is afebrile nontoxic appearing.  No neurological deficits and normal neuro exam.  Patient can walk with normal gait.  No loss of bowel or bladder control.  No concern for cauda equina.  No fever, night sweats, weight loss, h/o cancer, IVDU.  Encouraged patient is naproxen and Robaxin. Also encouraged ice and back exercises. I encouraged him to follow-up with his primary care provider and orthopedic surgery. I advised the patient to follow-up with their primary care provider this week. I advised the patient to return to the emergency department with new or worsening symptoms or new concerns. The patient verbalized understanding and agreement with plan.    Final Clinical Impressions(s) / ED Diagnoses   Final diagnoses:  Chronic bilateral low back pain, with sciatica presence unspecified    New Prescriptions New Prescriptions   METHOCARBAMOL (ROBAXIN) 500 MG TABLET    Take 1 tablet (500 mg total) by mouth 2 (two) times daily as needed for muscle spasms.   NAPROXEN (NAPROSYN) 500 MG TABLET    Take 1 tablet (500 mg total) by mouth 2 (two) times daily with a meal.     Waynetta Pean, PA-C 05/04/16 1943    Carmin Muskrat, MD 05/04/16 2351

## 2016-07-20 ENCOUNTER — Telehealth (INDEPENDENT_AMBULATORY_CARE_PROVIDER_SITE_OTHER): Payer: Self-pay | Admitting: Specialist

## 2016-07-20 ENCOUNTER — Ambulatory Visit (INDEPENDENT_AMBULATORY_CARE_PROVIDER_SITE_OTHER): Payer: Medicaid Other

## 2016-07-20 ENCOUNTER — Encounter (INDEPENDENT_AMBULATORY_CARE_PROVIDER_SITE_OTHER): Payer: Self-pay

## 2016-07-20 ENCOUNTER — Encounter (INDEPENDENT_AMBULATORY_CARE_PROVIDER_SITE_OTHER): Payer: Self-pay | Admitting: Specialist

## 2016-07-20 ENCOUNTER — Ambulatory Visit (INDEPENDENT_AMBULATORY_CARE_PROVIDER_SITE_OTHER): Payer: Medicaid Other | Admitting: Specialist

## 2016-07-20 VITALS — BP 141/91 | HR 81 | Ht 74.0 in | Wt 240.0 lb

## 2016-07-20 DIAGNOSIS — M5442 Lumbago with sciatica, left side: Secondary | ICD-10-CM

## 2016-07-20 DIAGNOSIS — M5441 Lumbago with sciatica, right side: Secondary | ICD-10-CM | POA: Diagnosis not present

## 2016-07-20 DIAGNOSIS — G8929 Other chronic pain: Secondary | ICD-10-CM

## 2016-07-20 MED ORDER — OXYCODONE-ACETAMINOPHEN 10-325 MG PO TABS: 1.0000 | ORAL_TABLET | Freq: Four times a day (QID) | ORAL | 0 refills | Status: DC | PRN

## 2016-07-20 NOTE — Telephone Encounter (Signed)
Pt needs 2 wk fu for nitka please advise [phone]
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
## 2016-07-20 NOTE — Patient Instructions (Signed)
Avoid frequent bending and stooping  No lifting greater than 10 lbs. May use ice or moist heat for pain. Weight loss is of benefit.   

## 2016-07-21 ENCOUNTER — Telehealth (INDEPENDENT_AMBULATORY_CARE_PROVIDER_SITE_OTHER): Payer: Self-pay | Admitting: Specialist

## 2016-07-21 NOTE — Telephone Encounter (Signed)
Pt called stating he needs PA for his percocet that he was prescribed yesterday

## 2016-07-23 NOTE — Telephone Encounter (Signed)
Faxed prior auth form to [facility] for approval.

## 2016-07-23 NOTE — Telephone Encounter (Signed)
I have faxed prior auth for to [facility] for approval

## 2016-07-27 NOTE — Telephone Encounter (Signed)
Patient wants a refill on his pain meds--

## 2016-07-27 NOTE — Telephone Encounter (Signed)
Pt  Called again stated he could pay out of pocket if we could refill this for him...please advise as he stated he has done this previously   [phone]

## 2016-07-27 NOTE — Telephone Encounter (Signed)
I called and spoke with Kerry Wiley about patient, I scheduled follow up appointment for 1/25/7 @1045  to review MRI.

## 2016-07-28 NOTE — Telephone Encounter (Signed)
Pt called again asking status of refill

## 2016-07-31 ENCOUNTER — Telehealth (INDEPENDENT_AMBULATORY_CARE_PROVIDER_SITE_OTHER): Payer: Self-pay | Admitting: *Deleted

## 2016-07-31 NOTE — Telephone Encounter (Signed)
Pt called about RX refill, also asked if the PA could be done so he could pick it up

## 2016-08-01 ENCOUNTER — Other Ambulatory Visit (INDEPENDENT_AMBULATORY_CARE_PROVIDER_SITE_OTHER): Payer: Self-pay | Admitting: Specialist

## 2016-08-01 MED ORDER — OXYCODONE-ACETAMINOPHEN 10-325 MG PO TABS: 1.0000 | ORAL_TABLET | Freq: Four times a day (QID) | ORAL | 0 refills | Status: DC | PRN

## 2016-08-01 NOTE — Telephone Encounter (Signed)
Tried to call patient to advise that rx was ready for pick up but his phone was not accepting calls at this time ----I will call him back on Monday.

## 2016-08-01 NOTE — Progress Notes (Signed)
Tried to call patient to advise that rx was ready for pick up but his phone was not accepting calls at this time ----I will call him back on Monday.

## 2016-08-01 NOTE — Telephone Encounter (Signed)
Rx printed and signed for the patient to pick up. jen

## 2016-08-03 ENCOUNTER — Telehealth (INDEPENDENT_AMBULATORY_CARE_PROVIDER_SITE_OTHER): Payer: Self-pay | Admitting: Specialist

## 2016-08-03 ENCOUNTER — Other Ambulatory Visit: Payer: Self-pay

## 2016-08-03 NOTE — Telephone Encounter (Signed)
Have patient to schedule  Next available and to call back and see if there as been a cancellation and I will keep loking for something sooner also.

## 2016-08-03 NOTE — Progress Notes (Signed)
Patient is aware rx is ready for pick up 

## 2016-08-03 NOTE — Telephone Encounter (Signed)
Pt called stating he missed his MRI so his appt is review is before his MRI. I tried to schedule pt for next available in Feb. But pt stated Dr. Louanne Skye needed to see him ASAP.

## 2016-08-03 NOTE — Telephone Encounter (Signed)
ERROR

## 2016-08-04 NOTE — Telephone Encounter (Signed)
I called [facility], Meds were approved, Auth # [id number], Interaction ID # [id number], I called and left message on machine to advise patient of this.

## 2016-08-05 ENCOUNTER — Telehealth (INDEPENDENT_AMBULATORY_CARE_PROVIDER_SITE_OTHER): Payer: Self-pay | Admitting: Specialist

## 2016-08-05 NOTE — Telephone Encounter (Signed)
He was given a #20 rx which should last 5 days, filled on 1/22, can not refill till 1/27, can pick up Rx tomorrow.  This medication is a controlled substance, state law prohibits prescribers from prescribing more than a weeks supply of the medication. Mr. Gilberto has received #60 tablets for oxycodone since 07/20/2016 which is more than enough considering these are 10 mg tablets. jen

## 2016-08-05 NOTE — Telephone Encounter (Signed)
Patient called advised medicaid only auth for him to get 20 tabs of the percocet. Patient asked what should he do to get the rest of his Rx. Patient asked if Dr Louanne Skye assistant can contact the pharmacy. The number to contact patient is [phone]

## 2016-08-05 NOTE — Telephone Encounter (Signed)
Please message below.

## 2016-08-06 ENCOUNTER — Other Ambulatory Visit (INDEPENDENT_AMBULATORY_CARE_PROVIDER_SITE_OTHER): Payer: Self-pay | Admitting: Specialist

## 2016-08-06 ENCOUNTER — Ambulatory Visit (INDEPENDENT_AMBULATORY_CARE_PROVIDER_SITE_OTHER): Payer: Medicaid Other | Admitting: Specialist

## 2016-08-06 ENCOUNTER — Telehealth (INDEPENDENT_AMBULATORY_CARE_PROVIDER_SITE_OTHER): Payer: Self-pay | Admitting: Specialist

## 2016-08-06 MED ORDER — OXYCODONE-ACETAMINOPHEN 10-325 MG PO TABS: 1.0000 | ORAL_TABLET | Freq: Four times a day (QID) | ORAL | 0 refills | Status: AC | PRN

## 2016-08-06 NOTE — Telephone Encounter (Signed)
Rx approved for #20, printed and signed for patient to pick up tomorrow (Friday 1/26)

## 2016-08-06 NOTE — Telephone Encounter (Signed)
Oxycodone Rx- 5 day supply was approved and expires on Friday. Pt uses [facility] on [address] and has medicaid.  Please call patient and explain this procedure of authorization and approval for his Rx.

## 2016-08-06 NOTE — Telephone Encounter (Signed)
Can he get and rx for # 20---since that is all Medicaid will approve.

## 2016-08-07 ENCOUNTER — Inpatient Hospital Stay: Admission: RE | Admit: 2016-08-07 | Payer: Self-pay | Source: Ambulatory Visit

## 2016-08-07 NOTE — Telephone Encounter (Signed)
ptatient has rx

## 2016-08-07 NOTE — Telephone Encounter (Signed)
Patient has rx.

## 2016-08-12 ENCOUNTER — Inpatient Hospital Stay: Admission: RE | Admit: 2016-08-12 | Payer: Self-pay | Source: Ambulatory Visit

## 2016-08-12 ENCOUNTER — Telehealth (INDEPENDENT_AMBULATORY_CARE_PROVIDER_SITE_OTHER): Payer: Self-pay | Admitting: Specialist

## 2016-08-12 NOTE — Telephone Encounter (Signed)
Pt requesting refill of meds

## 2016-08-12 NOTE — Telephone Encounter (Signed)
Pt requesting refill of meds  [phone]

## 2016-08-14 NOTE — Telephone Encounter (Signed)
Has there been an MRI, it was ordered at his last visit. By now the study should be done. I can not give out controlled substance without understanding the need for the medication. Please see if the MRI has been ordered. jen

## 2016-08-17 NOTE — Telephone Encounter (Signed)
He was schedule for MRI on 08/07/16 and cancelled appointment, the was rescheduled for 08/12/2016 and he no showed this appointment.  He has not rescheduled it yet.

## 2016-08-18 ENCOUNTER — Ambulatory Visit
Admission: RE | Admit: 2016-08-18 | Discharge: 2016-08-18 | Disposition: A | Discharge status: Home / Self Care | Payer: Medicaid Other | Source: Ambulatory Visit | Attending: Specialist | Admitting: Specialist

## 2016-08-18 ENCOUNTER — Other Ambulatory Visit (INDEPENDENT_AMBULATORY_CARE_PROVIDER_SITE_OTHER): Payer: Self-pay | Admitting: Specialist

## 2016-08-18 DIAGNOSIS — G8929 Other chronic pain: Secondary | ICD-10-CM

## 2016-08-18 DIAGNOSIS — M5441 Lumbago with sciatica, right side: Principal | ICD-10-CM

## 2016-08-18 DIAGNOSIS — M5442 Lumbago with sciatica, left side: Principal | ICD-10-CM

## 2016-08-18 DIAGNOSIS — Z77018 Contact with and (suspected) exposure to other hazardous metals: Secondary | ICD-10-CM

## 2016-08-18 MED ORDER — GADOBENATE DIMEGLUMINE 529 MG/ML IV SOLN
20.0000 mL | Freq: Once | INTRAVENOUS | Status: AC | PRN
  Administered 2016-08-18: 20 mL via INTRAVENOUS

## 2016-08-18 MED ORDER — TRAMADOL HCL 50 MG PO TABS: 100.0000 mg | ORAL_TABLET | Freq: Four times a day (QID) | ORAL | 0 refills | Status: DC | PRN

## 2016-08-18 NOTE — Telephone Encounter (Signed)
No further narcotics as he is not following through with recommended testing. jen

## 2016-08-18 NOTE — Telephone Encounter (Signed)
I called patient and advised on message below he states that he had his scan today.

## 2016-08-19 NOTE — Progress Notes (Signed)
Advised patients wife that he has an rx ready for pick up at the front desk.

## 2016-08-28 ENCOUNTER — Ambulatory Visit (INDEPENDENT_AMBULATORY_CARE_PROVIDER_SITE_OTHER): Payer: Medicaid Other | Admitting: Specialist

## 2016-08-28 ENCOUNTER — Telehealth (INDEPENDENT_AMBULATORY_CARE_PROVIDER_SITE_OTHER): Payer: Self-pay | Admitting: *Deleted

## 2016-08-28 ENCOUNTER — Encounter (INDEPENDENT_AMBULATORY_CARE_PROVIDER_SITE_OTHER): Payer: Self-pay | Admitting: Specialist

## 2016-08-28 VITALS — BP 127/89 | HR 69 | Ht 74.0 in | Wt 240.0 lb

## 2016-08-28 DIAGNOSIS — M5136 Other intervertebral disc degeneration, lumbar region: Secondary | ICD-10-CM

## 2016-08-28 MED ORDER — NORTRIPTYLINE HCL 25 MG PO CAPS: 25.0000 mg | ORAL_CAPSULE | Freq: Every day | ORAL | 3 refills | Status: AC

## 2016-08-28 MED ORDER — OXAPROZIN 600 MG PO TABS: ORAL_TABLET | ORAL | 3 refills | Status: AC

## 2016-08-28 NOTE — Patient Instructions (Signed)
Avoid frequent bending and stooping  No lifting greater than 10 lbs. May use ice or moist heat for pain. Weight loss is of benefit.   

## 2016-08-28 NOTE — Progress Notes (Signed)
Office Visit Note   Patient: Kerry Wiley           Date of Birth: Dec 01, 1975           MRN: [id number] Visit Date: 08/28/2016              Requested by: Nolene Ebbs, MD [address] [place], [place] [address] PCP: Philis Fendt, MD   Assessment & Plan: Visit Diagnoses:  1. DDD (degenerative disc disease), lumbar     Plan: Avoid frequent bending and stooping  No lifting greater than 10 lbs. May use ice or moist heat for pain. Weight loss is of benefit.   Follow-Up Instructions: Return in about 4 weeks (around 09/25/2016).   Orders:  Orders Placed This Encounter  Procedures  . Ambulatory referral to Physical Therapy   Meds ordered this encounter  Medications  . nortriptyline (PAMELOR) 25 MG capsule    Sig: Take 1 capsule (25 mg total) by mouth at bedtime.    Dispense:  30 capsule    Refill:  3  . oxaprozin (DAYPRO) 600 MG tablet    Sig: Take one tablet twice a day for 2 weeks then, 0ne tablet  q day with meal or snack prn.    Dispense:  60 tablet    Refill:  3      Procedures: No procedures performed   Clinical Data: No additional findings.   Subjective: Chief Complaint  Patient presents with  . Lower Back - Follow-up    MRI Review    Kerry Wiley is here to review MRI of Lumbar spine. He states that he is still having a lot of pain, he was last given rx for Tramadol and states that he is taking 4-5 at a time without relief. Pain was Worsened no relief with taking ultram.     Review of Systems  Constitutional: Negative.   HENT: Negative.   Eyes: Negative.   Respiratory: Negative.   Cardiovascular: Negative.   Gastrointestinal: Negative.   Endocrine: Negative.   Genitourinary: Negative.   Musculoskeletal: Negative.   Skin: Negative.   Allergic/Immunologic: Negative.   Neurological: Negative.   Hematological: Negative.   Psychiatric/Behavioral: Negative.      Objective: Vital Signs: BP 127/89 (BP Location: Left Arm, Patient Position:  Sitting)   Pulse 69   Ht 6\' 2"  (1.88 m)   Wt 240 lb (108.9 kg)   BMI 30.81 kg/m   Physical Exam  Constitutional: He is oriented to person, place, and time. He appears well-developed and well-nourished.  HENT:  Head: Normocephalic and atraumatic.  Eyes: EOM are normal. Pupils are equal, round, and reactive to light.  Neck: Normal range of motion. Neck supple.  Pulmonary/Chest: Effort normal and breath sounds normal.  Abdominal: Soft. Bowel sounds are normal.  Neurological: He is alert and oriented to person, place, and time.  Skin: Skin is warm and dry.  Psychiatric: He has a normal mood and affect. His behavior is normal. Judgment and thought content normal.    Back Exam   Tenderness  The patient is experiencing tenderness in the lumbar.  Range of Motion  Extension: abnormal  Lateral Bend Right: abnormal  Lateral Bend Left: abnormal  Rotation Right: abnormal  Rotation Left: abnormal   Tests  Straight leg raise right: negative Straight leg raise left: negative      Specialty Comments:  No specialty comments available.  Imaging: No results found.   PMFS History: Patient Active Problem List   Diagnosis Date Noted  .  Spinal stenosis, lumbar region, with neurogenic claudication 08/13/2015    Priority: High    Class: Chronic  . Viral URI with cough 08/16/2015    Priority: Medium    Class: Acute  . Lumbar disc herniation 08/13/2015    Priority: Medium    Class: Chronic  . Neurogenic claudication due to lumbar spinal stenosis 08/13/2015  . Suicidal ideation   . Cocaine abuse   . Fracture metacarpal-open 04/04/2012  . Metacarpal bone fracture 02/17/2012   Past Medical History:  Diagnosis Date  . Cancer ([place]) 12   cyst? on rt kidney bx done   . Gallstone   . GERD (gastroesophageal reflux disease)    states he has some but not "diagnosed"  . History of kidney stones     No family history on file.  Past Surgical History:  Procedure Laterality Date  .  APPENDECTOMY    . CHOLECYSTECTOMY     2011  . FRACTURE SURGERY Right 95   leg plates, screws  . HAND SURGERY Right 99   metal  . HARDWARE REMOVAL  04/04/2012   Procedure: HARDWARE REMOVAL;  Surgeon: Schuyler Amor, MD;  Location: [facility];  Service: Orthopedics;  Laterality: Right;  right hand hardware removal/tenolysis  . JOINT REPLACEMENT     knee and ankle replacement-1997  . LUMBAR LAMINECTOMY/DECOMPRESSION MICRODISCECTOMY N/A 08/13/2015   Procedure: BILATERAL LATERAL RECESSED DECOMPRESSION L4 - L5;  Surgeon: Jessy Oto, MD;  Location: [facility];  Service: Orthopedics;  Laterality: N/A;   Social History   Occupational History  . Not on file.   Social History Main Topics  . Smoking status: Current Every Day Smoker    Packs/day: 0.50    Types: Cigarettes  . Smokeless tobacco: Never Used  . Alcohol use 1.2 oz/week    2 Shots of liquor per week     Comment: social drinkier  . Drug use: Yes    Types: Cocaine     Comment: weekend   . Sexual activity: Not on file     Comment: smoke 1 pack a day

## 2016-08-28 NOTE — Telephone Encounter (Signed)
Patient called in this afternoon in regards to a prior-authorization for Oxycodone? He wanted to check on the status of that please. His CB # [phone]. Thank you

## 2016-08-31 NOTE — Telephone Encounter (Signed)
Left message on machine that there has not been an rx for this medication since 08/06/2016---so at this point there is no prior auth to be done for the oxycodone.  I have submitted for the ones that have been rx'd.

## 2016-12-02 ENCOUNTER — Ambulatory Visit (INDEPENDENT_AMBULATORY_CARE_PROVIDER_SITE_OTHER): Payer: Medicaid Other

## 2016-12-02 ENCOUNTER — Ambulatory Visit (INDEPENDENT_AMBULATORY_CARE_PROVIDER_SITE_OTHER): Payer: Medicaid Other | Admitting: Orthopaedic Surgery

## 2016-12-02 ENCOUNTER — Telehealth (INDEPENDENT_AMBULATORY_CARE_PROVIDER_SITE_OTHER): Payer: Self-pay | Admitting: Radiology

## 2016-12-02 DIAGNOSIS — M25551 Pain in right hip: Secondary | ICD-10-CM

## 2016-12-02 DIAGNOSIS — M25552 Pain in left hip: Secondary | ICD-10-CM

## 2016-12-02 MED ORDER — TIZANIDINE HCL 4 MG PO TABS: 4.0000 mg | ORAL_TABLET | Freq: Three times a day (TID) | ORAL | 0 refills | Status: AC | PRN

## 2016-12-02 MED ORDER — HYDROCODONE-ACETAMINOPHEN 5-325 MG PO TABS: 1.0000 | ORAL_TABLET | Freq: Two times a day (BID) | ORAL | 0 refills | Status: DC | PRN

## 2016-12-02 MED ORDER — NAPROXEN 500 MG PO TABS: 500.0000 mg | ORAL_TABLET | Freq: Two times a day (BID) | ORAL | 0 refills | Status: DC | PRN

## 2016-12-02 NOTE — Progress Notes (Signed)
Office Visit Note   Patient: Kerry Wiley           Date of Birth: 05/27/1976           MRN: [id number] Visit Date: 12/02/2016              Requested by: Nolene Ebbs, MD [address] [place], [place] [address] PCP: Nolene Ebbs, MD   Assessment & Plan: Visit Diagnoses:  1. Bilateral hip pain     Plan: I will put him on Norco, Zanaflex, and naproxen. He deathly needs an MRI of his right hip to rule out AVN based on his clinical exam and what I'm seeing on plain films in terms of the potential cystic changes in the femoral head. He understands this fully and I'll see him back in 2 weeks with hopefully will have an MRI obtained and done.  Follow-Up Instructions: Return in about 2 weeks (around 12/16/2016).   Orders:  Orders Placed This Encounter  Procedures  . XR HIPS BILAT W OR W/O PELVIS 2V   No orders of the defined types were placed in this encounter.     Procedures: No procedures performed   Clinical Data: No additional findings.   Subjective: No chief complaint on file. Patient is now this patient brought was but I'm seeing for the first time with chief complaint of bilateral hip pain with right worse than left. Is been coming severe. Hurts in his groin. It is worsened over the last year. Is been getting significantly worse over the last month. He denies any specific injuries. He's had a history of cocaine abuse in the past but he denies alcohol abuse. He has been on steroids off and on the past for other issues. He's had multiple back surgeries in the past.  HPI  Review of Systems He currently denies any headache, chest pain, towards breath, fever, chills, nausea, vomiting  Objective: Vital Signs: There were no vitals taken for this visit.  Physical Exam He is alert or 3 in no acute distress Ortho Exam Examination of both hips show severe pain in the groin with internal rotation rotation with the right much worse than the left. Specialty Comments:    No specialty comments available.  Imaging: No results found.   PMFS History: Patient Active Problem List   Diagnosis Date Noted  . Viral URI with cough 08/16/2015    Class: Acute  . Spinal stenosis, lumbar region, with neurogenic claudication 08/13/2015    Class: Chronic  . Lumbar disc herniation 08/13/2015    Class: Chronic  . Neurogenic claudication due to lumbar spinal stenosis 08/13/2015  . Suicidal ideation   . Cocaine abuse   . Fracture metacarpal-open 04/04/2012  . Metacarpal bone fracture 02/17/2012   Past Medical History:  Diagnosis Date  . 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 Gallstone   . GERD (gastroesophageal reflux disease)    states he has some but not "diagnosed"  . History of kidney stones     No family history on file.  Past Surgical History:  Procedure Laterality Date  . APPENDECTOMY    . CHOLECYSTECTOMY     2011  . FRACTURE SURGERY Right 95   leg plates, screws  . HAND SURGERY Right 99   metal  . HARDWARE REMOVAL  04/04/2012   Procedure: HARDWARE REMOVAL;  Surgeon: Schuyler Amor, MD;  Location: [facility];  Service: Orthopedics;  Laterality: Right;  right hand hardware  removal/tenolysis  . JOINT REPLACEMENT     knee and ankle replacement-1997  . LUMBAR LAMINECTOMY/DECOMPRESSION MICRODISCECTOMY N/A 08/13/2015   Procedure: BILATERAL LATERAL RECESSED DECOMPRESSION L4 - L5;  Surgeon: Jessy Oto, MD;  Location: [facility];  Service: Orthopedics;  Laterality: N/A;   Social History   Occupational History  . Not on file.   Social History Main Topics  . Smoking status: Current Every Day Smoker    Packs/day: 0.50    Types: Cigarettes  . Smokeless tobacco: Never Used  . Alcohol use 1.2 oz/week    2 Shots of liquor per week     Comment: social drinkier  . Drug use: Yes    Types: Cocaine     Comment: weekend   . Sexual activity: Not on file     Comment: smoke 1 pack a day

## 2016-12-02 NOTE — Telephone Encounter (Signed)
Patient calling left voicemail on triage line he is at the pharmacy now. Prior authorization needed for hydrocodone rx. I called patient to advise that I would start working on this. This was ran through [facility] provider portal. Pending prior authorization. Tried rechecking system at 443 and authorization is still pending. I again checked system at 452 and authorization is still pending. I called patient and apologized, no matter what narcotics were written today everything would require this prior auth due to [employer]. I apologized again but assured I would recheck first thing in the morning and call him and his pharmacy back.

## 2016-12-03 ENCOUNTER — Other Ambulatory Visit (INDEPENDENT_AMBULATORY_CARE_PROVIDER_SITE_OTHER): Payer: Self-pay

## 2016-12-03 DIAGNOSIS — M25551 Pain in right hip: Secondary | ICD-10-CM

## 2016-12-03 NOTE — Telephone Encounter (Signed)
[facility] was approved. I called patient and left voicemail to make him aware.

## 2016-12-15 ENCOUNTER — Other Ambulatory Visit (INDEPENDENT_AMBULATORY_CARE_PROVIDER_SITE_OTHER): Payer: Self-pay

## 2016-12-15 DIAGNOSIS — M25551 Pain in right hip: Secondary | ICD-10-CM

## 2016-12-16 ENCOUNTER — Ambulatory Visit (INDEPENDENT_AMBULATORY_CARE_PROVIDER_SITE_OTHER): Payer: Medicaid Other | Admitting: Orthopaedic Surgery

## 2016-12-16 ENCOUNTER — Telehealth (INDEPENDENT_AMBULATORY_CARE_PROVIDER_SITE_OTHER): Payer: Self-pay | Admitting: Orthopaedic Surgery

## 2016-12-16 ENCOUNTER — Other Ambulatory Visit (INDEPENDENT_AMBULATORY_CARE_PROVIDER_SITE_OTHER): Payer: Self-pay

## 2016-12-16 MED ORDER — TRAMADOL HCL 50 MG PO TABS: ORAL_TABLET | ORAL | 0 refills | Status: DC

## 2016-12-16 NOTE — Telephone Encounter (Signed)
Based on narcotics laws, he can not get hydrocodone.  He can have tramadol or tylenol #3 and he does need a CT scan of his hip ands follow-up

## 2016-12-16 NOTE — Telephone Encounter (Signed)
Please advise (he NS his appt today)

## 2016-12-16 NOTE — Telephone Encounter (Signed)
Patient called advised he is out of his pain medicine (Hydrocodone) The number to contact patient is [phone]

## 2016-12-16 NOTE — Telephone Encounter (Signed)
Called into pharmacy, patient aware 

## 2016-12-24 ENCOUNTER — Other Ambulatory Visit: Payer: Self-pay

## 2016-12-31 ENCOUNTER — Ambulatory Visit (INDEPENDENT_AMBULATORY_CARE_PROVIDER_SITE_OTHER): Payer: Medicaid Other | Admitting: Orthopaedic Surgery

## 2017-01-29 ENCOUNTER — Other Ambulatory Visit (INDEPENDENT_AMBULATORY_CARE_PROVIDER_SITE_OTHER): Payer: Self-pay | Admitting: Orthopaedic Surgery

## 2017-01-29 MED ORDER — TRAMADOL HCL 50 MG PO TABS: ORAL_TABLET | ORAL | 0 refills | Status: AC

## 2017-01-29 NOTE — Telephone Encounter (Signed)
Patient called needing Rx refilled (Tramadol) The number patient is [phone]

## 2017-01-29 NOTE — Telephone Encounter (Signed)
Ok to rf? 

## 2017-01-29 NOTE — Telephone Encounter (Signed)
Ok to refill, 1-2 every 12 hours as needed, #60. Thanks

## 2017-01-29 NOTE — Telephone Encounter (Signed)
rx called. Patient advised done.

## 2017-02-09 ENCOUNTER — Ambulatory Visit (INDEPENDENT_AMBULATORY_CARE_PROVIDER_SITE_OTHER): Payer: Medicaid Other | Admitting: Orthopaedic Surgery

## 2017-02-18 ENCOUNTER — Telehealth (INDEPENDENT_AMBULATORY_CARE_PROVIDER_SITE_OTHER): Payer: Self-pay | Admitting: Orthopaedic Surgery

## 2017-02-18 NOTE — Telephone Encounter (Signed)
Fill the tramadol but this is the last one without follow up

## 2017-02-18 NOTE — Telephone Encounter (Signed)
Called into pharmacy

## 2017-02-18 NOTE — Telephone Encounter (Signed)
Patient called asking for a refill on tramadol and was asking for it to be sent to the [facility] on [address]. Thank you! And if you could give him a call when you're able to call in RX.CB # [phone]

## 2017-02-18 NOTE — Telephone Encounter (Signed)
Please advise 

## 2017-03-02 ENCOUNTER — Telehealth (INDEPENDENT_AMBULATORY_CARE_PROVIDER_SITE_OTHER): Payer: Self-pay | Admitting: Orthopaedic Surgery

## 2017-03-02 NOTE — Telephone Encounter (Signed)
Patient aware FYI--he states his insurance won't cover MRI

## 2017-03-02 NOTE — Telephone Encounter (Signed)
Patient called asking for a refill on tramadol to be sent into his pharmacy please. CB # [phone] If you could give him a call whenever it's been called in.

## 2017-03-02 NOTE — Telephone Encounter (Signed)
Please advise FYI-he has no showed his last two visits

## 2017-03-02 NOTE — Telephone Encounter (Signed)
He can get pain medications at this point from his primary care physician since he has not been back to any appointment. We had recommended and hopefully ordered MRI of his hips to rule out avascular necrosis and vessel really need to see and see him back for before we provide a more narcotics

## 2017-03-08 ENCOUNTER — Ambulatory Visit (INDEPENDENT_AMBULATORY_CARE_PROVIDER_SITE_OTHER): Payer: Medicaid Other | Admitting: Orthopaedic Surgery

## 2017-11-26 ENCOUNTER — Emergency Department (HOSPITAL_COMMUNITY): Payer: No Typology Code available for payment source

## 2017-11-26 ENCOUNTER — Emergency Department (HOSPITAL_COMMUNITY)
Admission: EM | Admit: 2017-11-26 | Discharge: 2017-11-26 | Disposition: A | Discharge status: Home / Self Care | Payer: No Typology Code available for payment source | Attending: Emergency Medicine | Admitting: Emergency Medicine

## 2017-11-26 ENCOUNTER — Encounter (HOSPITAL_COMMUNITY): Payer: Self-pay

## 2017-11-26 DIAGNOSIS — Z79899 Other long term (current) drug therapy: Secondary | ICD-10-CM | POA: Insufficient documentation

## 2017-11-26 DIAGNOSIS — M545 Low back pain: Secondary | ICD-10-CM | POA: Insufficient documentation

## 2017-11-26 DIAGNOSIS — S8490XA Injury of unspecified nerve at lower leg level, unspecified leg, initial encounter: Secondary | ICD-10-CM

## 2017-11-26 DIAGNOSIS — F17210 Nicotine dependence, cigarettes, uncomplicated: Secondary | ICD-10-CM | POA: Insufficient documentation

## 2017-11-26 DIAGNOSIS — R52 Pain, unspecified: Secondary | ICD-10-CM

## 2017-11-26 LAB — COMPREHENSIVE METABOLIC PANEL
ALBUMIN: 3.8 g/dL (ref 3.5–5.0)
ALK PHOS: 64 U/L (ref 38–126)
ALT: 19 U/L (ref 17–63)
ANION GAP: 9 (ref 5–15)
AST: 20 U/L (ref 15–41)
BUN: 11 mg/dL (ref 6–20)
CALCIUM: 9.3 mg/dL (ref 8.9–10.3)
CO2: 25 mmol/L (ref 22–32)
Chloride: 106 mmol/L (ref 101–111)
Creatinine, Ser: 1.3 mg/dL — ABNORMAL HIGH (ref 0.61–1.24)
GFR calc non Af Amer: >60 mL/min (ref >60)
GLUCOSE: 134 mg/dL — AB (ref 65–99)
POTASSIUM: 3.3 mmol/L — AB (ref 3.5–5.1)
SODIUM: 140 mmol/L (ref 135–145)
TOTAL PROTEIN: 6.5 g/dL (ref 6.5–8.1)
Total Bilirubin: 0.5 mg/dL (ref 0.3–1.2)

## 2017-11-26 LAB — PROTIME-INR
INR: 1.03
PROTHROMBIN TIME: 13.4 s (ref 11.4–15.2)

## 2017-11-26 LAB — CBC
HEMATOCRIT: 42.1 % (ref 39.0–52.0)
HEMOGLOBIN: 13.7 g/dL (ref 13.0–17.0)
MCH: 27.2 pg (ref 26.0–34.0)
MCHC: 32.5 g/dL (ref 30.0–36.0)
MCV: 83.7 fL (ref 78.0–100.0)
Platelets: 253 10*3/uL (ref 150–400)
RBC: 5.03 MIL/uL (ref 4.22–5.81)
RDW: 15.4 % (ref 11.5–15.5)
WBC: 7.2 10*3/uL (ref 4.0–10.5)

## 2017-11-26 LAB — I-STAT CHEM 8, ED
BUN: 12 mg/dL (ref 6–20)
CALCIUM ION: 1.12 mmol/L — AB (ref 1.15–1.40)
CHLORIDE: 105 mmol/L (ref 101–111)
Creatinine, Ser: 1.1 mg/dL (ref 0.61–1.24)
GLUCOSE: 138 mg/dL — AB (ref 65–99)
HCT: 43 % (ref 39.0–52.0)
Hemoglobin: 14.6 g/dL (ref 13.0–17.0)
Potassium: 3.2 mmol/L — ABNORMAL LOW (ref 3.5–5.1)
SODIUM: 142 mmol/L (ref 135–145)
TCO2: 24 mmol/L (ref 22–32)

## 2017-11-26 LAB — ETHANOL

## 2017-11-26 LAB — URINALYSIS, ROUTINE W REFLEX MICROSCOPIC
Bilirubin Urine: NEGATIVE
Glucose, UA: NEGATIVE mg/dL
Hgb urine dipstick: NEGATIVE
KETONES UR: 5 mg/dL — AB
LEUKOCYTES UA: NEGATIVE
NITRITE: NEGATIVE
PH: 5 (ref 5.0–8.0)
PROTEIN: NEGATIVE mg/dL
Specific Gravity, Urine: 1.028 (ref 1.005–1.030)

## 2017-11-26 LAB — SAMPLE TO BLOOD BANK

## 2017-11-26 LAB — I-STAT CG4 LACTIC ACID, ED: LACTIC ACID, VENOUS: 1.78 mmol/L (ref 0.5–1.9)

## 2017-11-26 LAB — CDS SEROLOGY

## 2017-11-26 MED ORDER — NAPROXEN 375 MG PO TABS: 375.0000 mg | ORAL_TABLET | Freq: Two times a day (BID) | ORAL | 0 refills | Status: AC

## 2017-11-26 MED ORDER — MORPHINE SULFATE (PF) 4 MG/ML IV SOLN
4.0000 mg | Freq: Once | INTRAVENOUS | Status: AC
  Administered 2017-11-26: 4 mg via INTRAVENOUS
  Filled 2017-11-26: qty 1

## 2017-11-26 MED ORDER — HYDROCODONE-ACETAMINOPHEN 5-325 MG PO TABS: 1.0000 | ORAL_TABLET | Freq: Four times a day (QID) | ORAL | 0 refills | Status: AC | PRN

## 2017-11-26 MED ORDER — FENTANYL CITRATE (PF) 100 MCG/2ML IJ SOLN
INTRAMUSCULAR | Status: AC | PRN
  Administered 2017-11-26: 100 ug via INTRAVENOUS

## 2017-11-26 MED ORDER — DEXAMETHASONE SODIUM PHOSPHATE 10 MG/ML IJ SOLN
10.0000 mg | Freq: Once | INTRAMUSCULAR | Status: AC
  Administered 2017-11-26: 10 mg via INTRAVENOUS
  Filled 2017-11-26: qty 1

## 2017-11-26 MED ORDER — POTASSIUM CHLORIDE CRYS ER 20 MEQ PO TBCR
40.0000 meq | EXTENDED_RELEASE_TABLET | Freq: Once | ORAL | Status: AC
  Administered 2017-11-26: 40 meq via ORAL
  Filled 2017-11-26: qty 2

## 2017-11-26 MED ORDER — LORAZEPAM 2 MG/ML IJ SOLN
1.0000 mg | Freq: Once | INTRAMUSCULAR | Status: AC
  Administered 2017-11-26: 1 mg via INTRAVENOUS
  Filled 2017-11-26: qty 1

## 2017-11-26 MED ORDER — KETOROLAC TROMETHAMINE 15 MG/ML IJ SOLN
15.0000 mg | Freq: Once | INTRAMUSCULAR | Status: AC
  Administered 2017-11-26: 15 mg via INTRAVENOUS
  Filled 2017-11-26: qty 1

## 2017-11-26 MED ORDER — METHYLPREDNISOLONE 4 MG PO TBPK: ORAL_TABLET | ORAL | 0 refills | Status: AC

## 2017-11-26 MED ORDER — FENTANYL CITRATE (PF) 100 MCG/2ML IJ SOLN
INTRAMUSCULAR | Status: AC
  Filled 2017-11-26: qty 2

## 2017-11-26 NOTE — ED Notes (Signed)
Pt ambulated with minor difficulty. Pt states painful to bare weight.

## 2017-11-26 NOTE — ED Notes (Signed)
Spoke with MRI stated they would be able to get patient around 1145 ish, patient aware.
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
## 2017-11-26 NOTE — ED Notes (Signed)
Pt returned from MRI °

## 2017-11-26 NOTE — ED Notes (Signed)
Pt verbalized understanding of discharge instructions.

## 2017-11-26 NOTE — ED Notes (Signed)
Paged ortho for crutches
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
## 2017-11-26 NOTE — ED Provider Notes (Signed)
CT imaging negative.  On exam, patient has persistent weakness along right lower extremity, worse distally along L3-5 distribution.  Patient does have history of previous disc herniations.  Given his ongoing weakness, numbness, neurological symptoms, concern for possible traumatic disc herniation.  Patient will need MRI as he cannot ambulate.  MR shows old disc disease but no new herniation. Pt deficits improving on exam, babinski is normal bilaterally on my exam, with 5/5 strength. Able to ambulate. Likely neuropraxia btu will give good return precautions. Will d/c on steroids, pain meds, outpt follow-up.   Duffy Bruce, MD 11/26/17 [phone]
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
## 2017-11-26 NOTE — ED Notes (Signed)
Ice chips given

## 2017-11-26 NOTE — ED Notes (Signed)
Patient transported to MRI 

## 2017-11-29 ENCOUNTER — Ambulatory Visit (INDEPENDENT_AMBULATORY_CARE_PROVIDER_SITE_OTHER): Payer: Self-pay | Admitting: Orthopaedic Surgery

## 2017-11-29 ENCOUNTER — Encounter (INDEPENDENT_AMBULATORY_CARE_PROVIDER_SITE_OTHER): Payer: Self-pay | Admitting: Orthopaedic Surgery

## 2017-11-29 VITALS — BP 123/72 | HR 76 | Ht 74.0 in | Wt 250.0 lb

## 2017-11-29 DIAGNOSIS — M25551 Pain in right hip: Secondary | ICD-10-CM

## 2017-11-29 DIAGNOSIS — M5126 Other intervertebral disc displacement, lumbar region: Secondary | ICD-10-CM
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
## 2017-12-23 ENCOUNTER — Ambulatory Visit (INDEPENDENT_AMBULATORY_CARE_PROVIDER_SITE_OTHER): Payer: Self-pay | Admitting: Specialist

## 2018-04-30 ENCOUNTER — Emergency Department (HOSPITAL_COMMUNITY): Payer: Self-pay

## 2018-04-30 ENCOUNTER — Emergency Department (HOSPITAL_COMMUNITY)
Admission: EM | Admit: 2018-04-30 | Discharge: 2018-04-30 | Disposition: A | Discharge status: Home / Self Care | Payer: Self-pay | Attending: Emergency Medicine | Admitting: Emergency Medicine

## 2018-04-30 ENCOUNTER — Other Ambulatory Visit: Payer: Self-pay

## 2018-04-30 ENCOUNTER — Encounter (HOSPITAL_COMMUNITY): Payer: Self-pay | Admitting: *Deleted

## 2018-04-30 DIAGNOSIS — Y999 Unspecified external cause status: Secondary | ICD-10-CM | POA: Insufficient documentation

## 2018-04-30 DIAGNOSIS — W1830XA Fall on same level, unspecified, initial encounter: Secondary | ICD-10-CM | POA: Insufficient documentation

## 2018-04-30 DIAGNOSIS — M79602 Pain in left arm: Secondary | ICD-10-CM | POA: Insufficient documentation

## 2018-04-30 DIAGNOSIS — F17210 Nicotine dependence, cigarettes, uncomplicated: Secondary | ICD-10-CM | POA: Insufficient documentation

## 2018-04-30 DIAGNOSIS — Y929 Unspecified place or not applicable: Secondary | ICD-10-CM | POA: Insufficient documentation

## 2018-04-30 DIAGNOSIS — Z79899 Other long term (current) drug therapy: Secondary | ICD-10-CM | POA: Insufficient documentation

## 2018-04-30 DIAGNOSIS — L0291 Cutaneous abscess, unspecified: Secondary | ICD-10-CM

## 2018-04-30 DIAGNOSIS — Y9302 Activity, running: Secondary | ICD-10-CM | POA: Insufficient documentation

## 2018-04-30 DIAGNOSIS — L02414 Cutaneous abscess of left upper limb: Secondary | ICD-10-CM | POA: Insufficient documentation

## 2018-04-30 DIAGNOSIS — F1490 Cocaine use, unspecified, uncomplicated: Secondary | ICD-10-CM | POA: Insufficient documentation

## 2018-04-30 MED ORDER — ACETAMINOPHEN 500 MG PO TABS
1000.0000 mg | ORAL_TABLET | Freq: Once | ORAL | Status: AC
  Administered 2018-04-30: 1000 mg via ORAL
  Filled 2018-04-30: qty 2

## 2018-04-30 MED ORDER — CEPHALEXIN 500 MG PO CAPS
500.0000 mg | ORAL_CAPSULE | Freq: Once | ORAL | Status: AC
  Administered 2018-04-30: 500 mg via ORAL
  Filled 2018-04-30: qty 1

## 2018-04-30 MED ORDER — CEPHALEXIN 500 MG PO CAPS: 500.0000 mg | ORAL_CAPSULE | Freq: Four times a day (QID) | ORAL | 0 refills | Status: AC

## 2018-04-30 MED ORDER — LIDOCAINE HCL (PF) 1 % IJ SOLN
10.0000 mL | Freq: Once | INTRAMUSCULAR | Status: AC
  Administered 2018-04-30: 10 mL via INTRADERMAL
  Filled 2018-04-30: qty 30

## 2018-04-30 NOTE — ED Provider Notes (Addendum)
[facility] DEPT Provider Note   CSN: [id number] Arrival date & time: 04/30/18  1813     History   Chief Complaint Chief Complaint  Patient presents with  . Arm Injury    left    HPI Kerry Wiley is a 42 y.o. male who presents for evaluation of left upper extremity pain.  He states that he initially injured it 2 days ago but does not know how.  He does recall if he fell on the arm.  Patient states that he did not seek evaluation but he thought it may be broken.  He states that today, the pain worsened.  He states he attempted to help a friend move and states that it made his pain worsen.  Patient arrives with [facility] police escort.  Please state that he was riding around in his stool and truck and was resisting arrest.  He states he did run out of the vehicle and tried to escape and when he fell, landing on his left arm.  As patient was being escorted by police to jail, he requested to come to the emergency department for evaluation of his arm pain.  Patient denies any shoulder pain, numbness/weakness.  The history is provided by the patient.    Past Medical History:  Diagnosis Date  . 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 Gallstone   . GERD (gastroesophageal reflux disease)    states he has some but not "diagnosed"  . History of kidney stones     Patient Active Problem List   Diagnosis Date Noted  . Viral URI with cough 08/16/2015    Class: Acute  . Spinal stenosis, lumbar region, with neurogenic claudication 08/13/2015    Class: Chronic  . Lumbar disc herniation 08/13/2015    Class: Chronic  . Neurogenic claudication due to lumbar spinal stenosis 08/13/2015  . Suicidal ideation   . Cocaine abuse ([place])   . Fracture metacarpal-open 04/04/2012  . Metacarpal bone fracture 02/17/2012    Past Surgical History:  Procedure Laterality Date  . APPENDECTOMY    . CHOLECYSTECTOMY     2011  . FRACTURE SURGERY Right 95   leg  plates, screws  . HAND SURGERY Right 99   metal  . HARDWARE REMOVAL  04/04/2012   Procedure: HARDWARE REMOVAL;  Surgeon: Schuyler Amor, MD;  Location: [facility];  Service: Orthopedics;  Laterality: Right;  right hand hardware removal/tenolysis  . JOINT REPLACEMENT     knee and ankle replacement-1997  . LUMBAR LAMINECTOMY/DECOMPRESSION MICRODISCECTOMY N/A 08/13/2015   Procedure: BILATERAL LATERAL RECESSED DECOMPRESSION L4 - L5;  Surgeon: Jessy Oto, MD;  Location: [facility];  Service: Orthopedics;  Laterality: N/A;        Home Medications    Prior to Admission medications   Medication Sig Start Date End Date Taking? Authorizing Provider  omeprazole (PRILOSEC) 20 MG capsule Take 20 mg by mouth daily.   Yes [provider]  cephALEXin (KEFLEX) 500 MG capsule Take 1 capsule (500 mg total) by mouth 4 (four) times daily for 7 days. 04/30/18 05/07/18  Volanda Napoleon, PA-C  dextromethorphan-guaiFENesin (MUCINEX DM) 30-600 MG 12hr tablet Take 1 tablet by mouth 2 (two) times daily. Patient not taking: Reported on 11/26/2017 08/16/15   Jessy Oto, MD  diphenhydrAMINE (BENADRYL) 25 MG tablet Take 1 tablet (25 mg total) by mouth every 6 (six) hours as needed for itching. Patient not taking: Reported on  11/26/2017 08/16/15   Jessy Oto, MD  docusate sodium (COLACE) 100 MG capsule Take 1 capsule (100 mg total) by mouth 2 (two) times daily. Patient not taking: Reported on 11/26/2017 08/15/15   Jessy Oto, MD  HYDROcodone-acetaminophen (NORCO/VICODIN) 5-325 MG tablet Take 1-2 tablets by mouth every 6 (six) hours as needed for severe pain. Patient not taking: Reported on 04/30/2018 11/26/17   Duffy Bruce, MD  methocarbamol (ROBAXIN) 500 MG tablet Take 1 tablet (500 mg total) by mouth 2 (two) times daily as needed for muscle spasms. Patient not taking: Reported on 11/26/2017 05/04/16   Waynetta Pean, PA-C  methylPREDNISolone (MEDROL DOSEPAK) 4 MG TBPK tablet Take as  directed on package Patient not taking: Reported on 04/30/2018 11/26/17   Duffy Bruce, MD  morphine (MS CONTIN) 30 MG 12 hr tablet Take 1 tablet (30 mg total) by mouth every 12 (twelve) hours. Patient not taking: Reported on 11/26/2017 08/15/15   Jessy Oto, MD  nortriptyline (PAMELOR) 25 MG capsule Take 1 capsule (25 mg total) by mouth at bedtime. Patient not taking: Reported on 11/26/2017 08/28/16   Jessy Oto, MD  oxaprozin (DAYPRO) 600 MG tablet Take one tablet twice a day for 2 weeks then, 0ne tablet  q day with meal or snack prn. Patient not taking: Reported on 11/26/2017 08/28/16   Jessy Oto, MD  oxyCODONE-acetaminophen (PERCOCET) 10-325 MG tablet Take 1 tablet by mouth every 6 (six) hours as needed for pain. Patient not taking: Reported on 11/26/2017 08/06/16   Jessy Oto, MD  tiZANidine (ZANAFLEX) 4 MG tablet Take 1 tablet (4 mg total) by mouth every 8 (eight) hours as needed for muscle spasms. Patient not taking: Reported on 11/26/2017 12/02/16   Mcarthur Rossetti, MD  traMADol Veatrice Bourbon) 50 MG tablet 1-2 every 2 hours as needed for pain Patient not taking: Reported on 11/26/2017 01/29/17   Mcarthur Rossetti, MD    Family History No family history on file.  Social History Social History   Tobacco Use  . Smoking status: Current Every Day Smoker    Packs/day: 0.50    Types: Cigarettes  . Smokeless tobacco: Never Used  Substance Use Topics  . Alcohol use: Yes    Alcohol/week: 2.0 standard drinks    Types: 2 Shots of liquor per week    Comment: social drinkier  . Drug use: Yes    Types: Cocaine    Comment: weekend      Allergies   Prednisone   Review of Systems Review of Systems  Constitutional: Negative for fever.  Musculoskeletal:       Arm pain  Skin: Negative for color change.  Neurological: Negative for weakness and numbness.  All other systems reviewed and are negative.    Physical Exam Updated Vital Signs BP (!) 147/90 (BP Location:  Right Arm)   Pulse 87   Temp 98.3 F (36.8 C) (Oral)   Resp 15   Ht 6\' 2"  (1.88 m)   Wt 106.6 kg   SpO2 93%   BMI 30.18 kg/m   Physical Exam  Constitutional: He appears well-developed and well-nourished.  HENT:  Head: Normocephalic and atraumatic.  Eyes: Conjunctivae and EOM are normal. Right eye exhibits no discharge. Left eye exhibits no discharge. No scleral icterus.  Cardiovascular:  Pulses:      Radial pulses are 2+ on the right side, and 2+ on the left side.  Pulmonary/Chest: Effort normal.  Musculoskeletal:  Left upper extremity compartment soft.  Tenderness palpation noted to the left wrist, left forearm.  No deformity or crepitus noted.  He has a small 1.5 cm fluctuant area noted to the volar aspect of the left wrist towards the ulnar aspect.  There is some mild surrounding induration noted to the ulnar aspect.  Flexion/extension of right wrist intact.  Full flexion/extension of all 5 digits of left hand intact without any difficulty.  No tenderness palpation noted to left elbow, left shoulder.  Full range of motion of left shoulder intact any difficulty.  No tenderness palpation noted to right upper extremity.  Full range of motion of right upper extremity without any difficulty.  Neurological: He is alert.  Sensation intact along major nerve distributions of BUE  Skin: Skin is warm and dry. Capillary refill takes less than 2 seconds.  Good distal cap refill.  LUE is not dusky in appearance or cool to touch.  1.5 cm fluctuant area noted to the volar aspect of the wrist towards the ulnar aspect.  There is some surrounding induration.  No surrounding warmth, erythema.  Psychiatric: He has a normal mood and affect. His speech is normal and behavior is normal.  Nursing note and vitals reviewed.    ED Treatments / Results  Labs (all labs ordered are listed, but only abnormal results are displayed) Labs Reviewed - No data to display  EKG None  Radiology Dg Forearm  Left  Result Date: 04/30/2018 CLINICAL DATA:  Left arm injury. EXAM: LEFT FOREARM - 2 VIEW COMPARISON:  None. FINDINGS: There is no evidence of fracture or other focal bone lesions. Soft tissues are unremarkable. IMPRESSION: Negative. Electronically Signed   By: Fidela Salisbury M.D.   On: 04/30/2018 18:56   Dg Wrist Complete Left  Result Date: 04/30/2018 CLINICAL DATA:  Left wrist pain while jumping of fence trying to evade the police. EXAM: LEFT WRIST - COMPLETE 3+ VIEW COMPARISON:  None. FINDINGS: There is no evidence of fracture or dislocation. There is no evidence of arthropathy or other focal bone abnormality. Ulnar-sided soft tissue swelling of the included forearm, wrist and hand. IMPRESSION: Soft tissue swelling along the ulnar aspect of the distal forearm, wrist and hand without radiopaque foreign body, fracture or joint dislocation. Electronically Signed   By: Ashley Royalty M.D.   On: 04/30/2018 19:24   Dg Hand Complete Left  Result Date: 04/30/2018 CLINICAL DATA:  Left hand pain after jumping a fence trying to evade the police. EXAM: LEFT HAND - COMPLETE 3+ VIEW COMPARISON:  None. FINDINGS: Soft tissue swelling along the ulnar aspect of the included forearm, wrist and hand is noted. No acute fracture or malalignment is seen. No radiopaque foreign body. IMPRESSION: Soft tissue swelling along the ulnar aspect of the distal forearm, wrist and hand without radiopaque foreign body, fracture or joint dislocation. Electronically Signed   By: Ashley Royalty M.D.   On: 04/30/2018 19:23    Procedures .Marland KitchenIncision and Drainage Date/Time: 04/30/2018 9:16 PM Performed by: Volanda Napoleon, PA-C Authorized by: Volanda Napoleon, PA-C   Consent:    Consent obtained:  Verbal   Consent given by:  Patient   Risks discussed:  Bleeding, incomplete drainage, pain and damage to other organs   Alternatives discussed:  No treatment Universal protocol:    Procedure explained and questions answered to  patient or proxy's satisfaction: yes     Relevant documents present and verified: yes     Test results available and properly labeled: yes     Imaging  studies available: yes     Required blood products, implants, devices, and special equipment available: yes     Site/side marked: yes     Immediately prior to procedure a time out was called: yes     Patient identity confirmed:  Verbally with patient Location:    Type:  Abscess   Size:  1.5   Location:  Upper extremity   Upper extremity location:  Wrist   Wrist location:  L wrist Pre-procedure details:    Skin preparation:  Betadine Anesthesia (see MAR for exact dosages):    Anesthesia method:  Local infiltration   Local anesthetic:  Lidocaine 1% w/o epi Procedure type:    Complexity:  Simple Procedure details:    Incision types:  Single straight   Incision depth:  Subcutaneous   Scalpel blade:  11   Wound management:  Probed and deloculated, irrigated with saline and extensive cleaning   Drainage:  Purulent   Drainage amount:  Scant Post-procedure details:    Patient tolerance of procedure:  Tolerated well, no immediate complications   (including critical care time)  Medications Ordered in ED Medications  cephALEXin (KEFLEX) capsule 500 mg (has no administration in time range)  acetaminophen (TYLENOL) tablet 1,000 mg (1,000 mg Oral Given 04/30/18 2019)  lidocaine (PF) (XYLOCAINE) 1 % injection 10 mL (10 mLs Intradermal Given 04/30/18 2034)     Initial Impression / Assessment and Plan / ED Course  I have reviewed the triage vital signs and the nursing notes.  Pertinent labs & imaging results that were available during my care of the patient were reviewed by me and considered in my medical decision making (see chart for details).     42 year old male who presents for evaluation of left upper extremity pain x2 days.  He reports that it started hurting 2 days ago.  He does not recall if he had any injury, trauma.  He states  that it started getting worse today.  He reports that he was helping a friend move and states that that made the pain worse.  Please report that he was feeling a truck and jumped out of the truck and fell landing on the left arm.  They do not know if that exacerbated his symptoms.  Patient denies any fevers, numbness/weakness. Patient is afebrile, non-toxic appearing, sitting comfortably on examination table. Vital signs reviewed and stable. Patient is neurovascularly intact.  On exam, he has tenderness palpation noted to left wrist, left forearm.  No tenderness palpation noted elbow or shoulder.  He has a small area of fluctuance noted to the volar aspect of the wrist with surrounding induration.  No surrounding warmth, erythema.  Consider fracture versus dislocation versus sprain.  History/physical exam is not concerning for compartment syndrome, acute arterial embolism, septic arthritis, DVT.  I suspect that small area is a small abscess.  Will get x-rays and evaluate.  X-rays reviewed.  No evidence of acute bony abnormality in the hand, wrist, forearm.  Discussed results with patient.  I&D as documented above.  Patient tolerated procedure well.  Given purulent drainage from the site, with surrounding induration, will plan to start patient on antibiotics.  Patient placed in Velcro wrist splint for support and stabilization.  Patient had one O2 sat at 89%.  This was when he was sleeping.  Repeat vitals are stable. Patient had ample opportunity for questions and discussion. All patient's questions were answered with full understanding. Strict return precautions discussed. Patient expresses understanding and agreement to plan.  Final Clinical Impressions(s) / ED Diagnoses   Final diagnoses:  Abscess  Pain of left upper extremity    ED Discharge Orders         Ordered    cephALEXin (KEFLEX) 500 MG capsule  4 times daily     04/30/18 2114           Volanda Napoleon, PA-C 04/30/18 2151     Volanda Napoleon, PA-C 04/30/18 2152    Deno Etienne, DO 04/30/18 2243

## 2018-04-30 NOTE — ED Triage Notes (Addendum)
EMS states pt has no idea how he injured his rt arm 2 days ago. Today he was running from police, jumped fence, when caught wanted to come to ED. Pt is in police custody. 118/80-97-97% ra- 18

## 2018-04-30 NOTE — ED Notes (Signed)
Bed: WA17 Expected date:  Expected time:  Means of arrival:  Comments: 42 yo arm injury in police custody

## 2018-04-30 NOTE — ED Notes (Signed)
Pt moving

## 2018-04-30 NOTE — Discharge Instructions (Signed)
Apply warm compresses to the area or soak the area in warm water to help continue express drainage.   Keep the wound clean and dry. Gently wash the wound with soap and water and make sure to pat it dry.   Wear splint for support and stabilization.    Take antibiotic and complete the entire course.   You can take Tylenol or Ibuprofen as directed for pain.  Followup with your primary care doctor  in 2 days for wound recheck.  Return to the Emergency Department if you experienced any worsening/spreading redness or swelling, fever, worsening pain, or any other worsening or concerning symptoms.

## 2019-04-21 IMAGING — DX DG WRIST COMPLETE 3+V*L*
4 series · 4 of 4 positions shown · non-contrast
Comparison: None.

CLINICAL DATA: Left wrist pain while jumping of fence trying to
evade the police.

EXAM:
LEFT WRIST - COMPLETE 3+ VIEW

[wrist ap (1 of 2)]
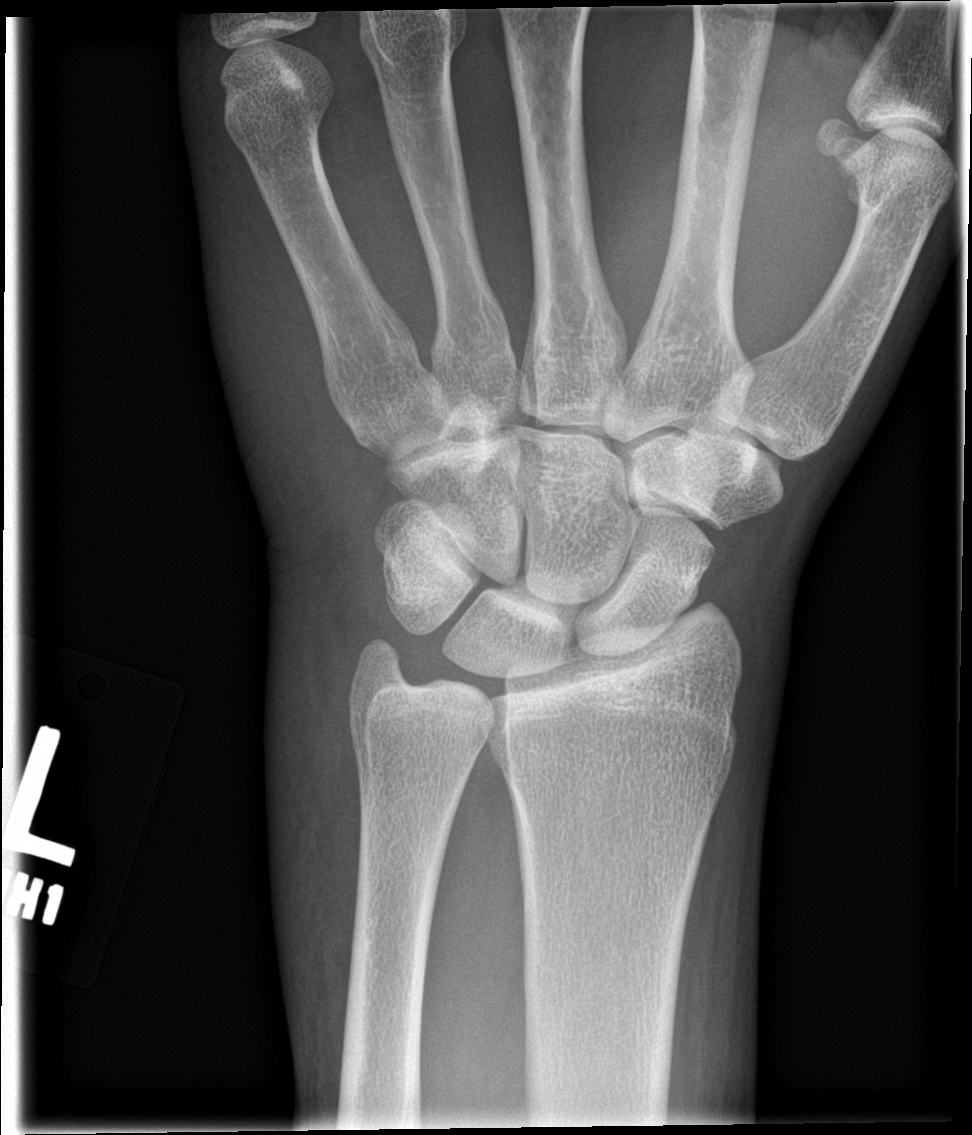

[wrist obl]
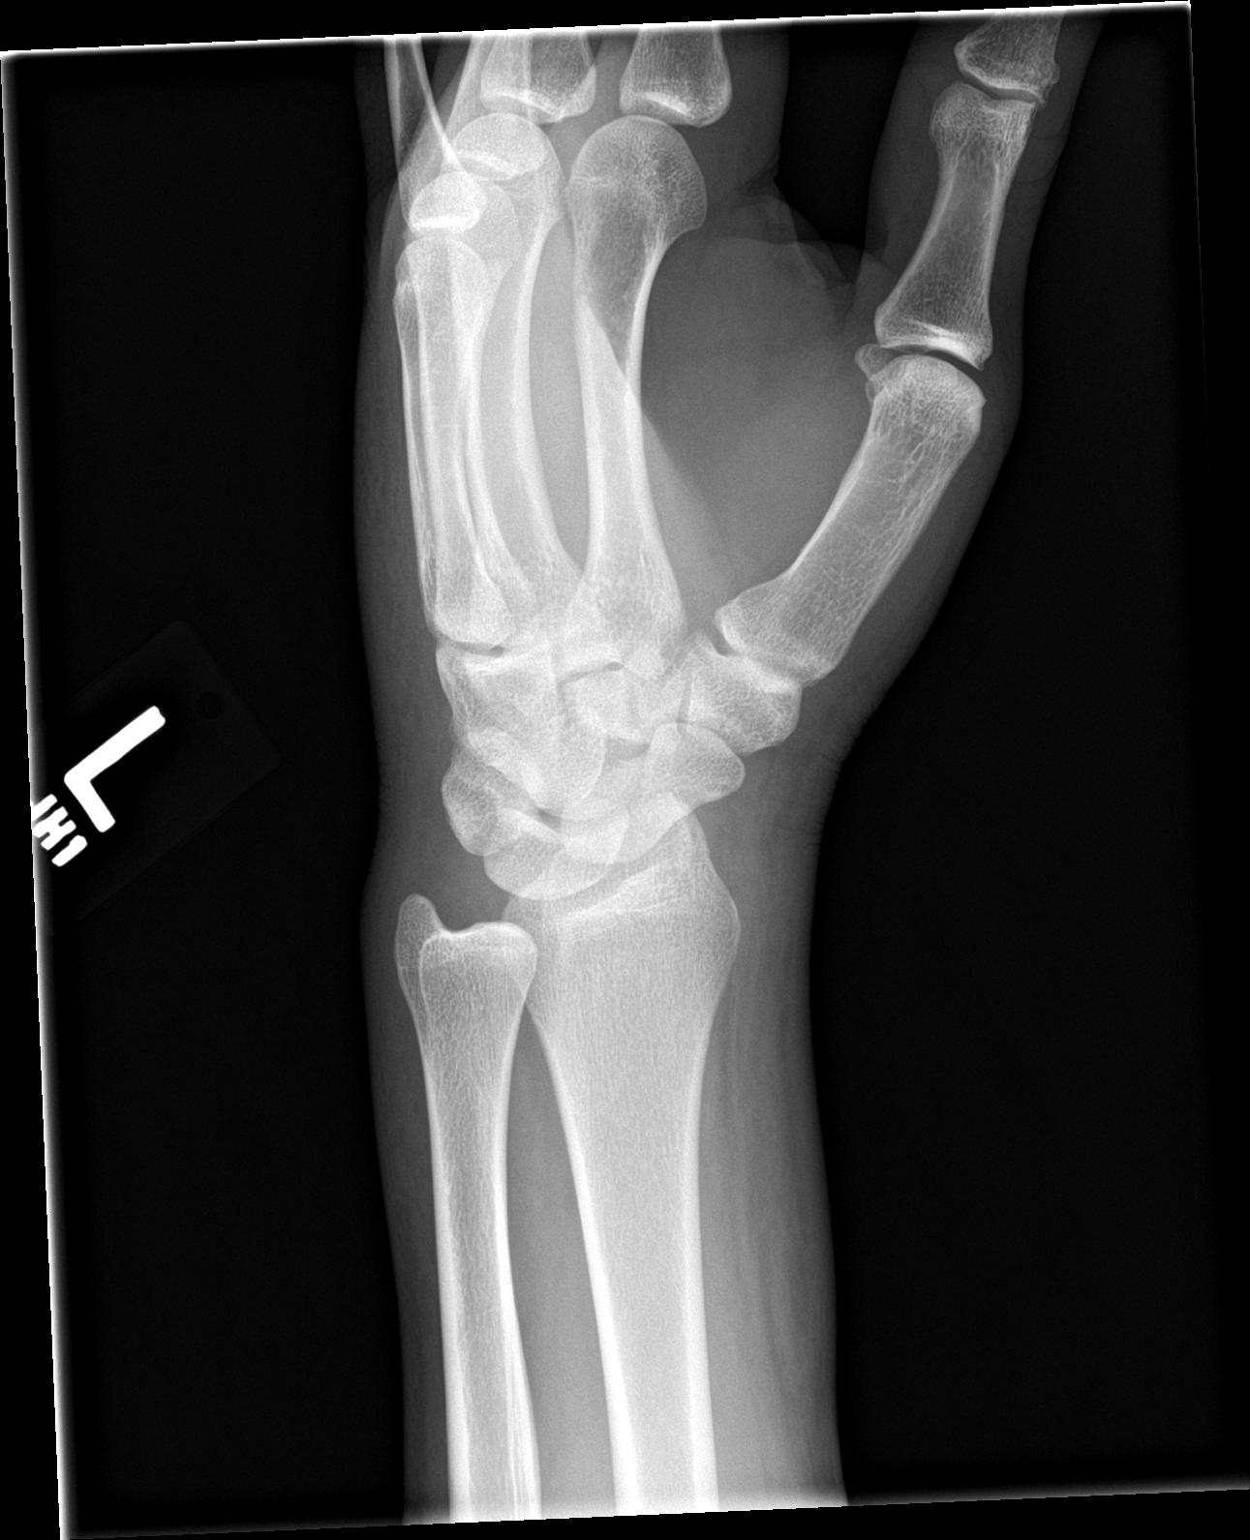

[wrist tunnel]
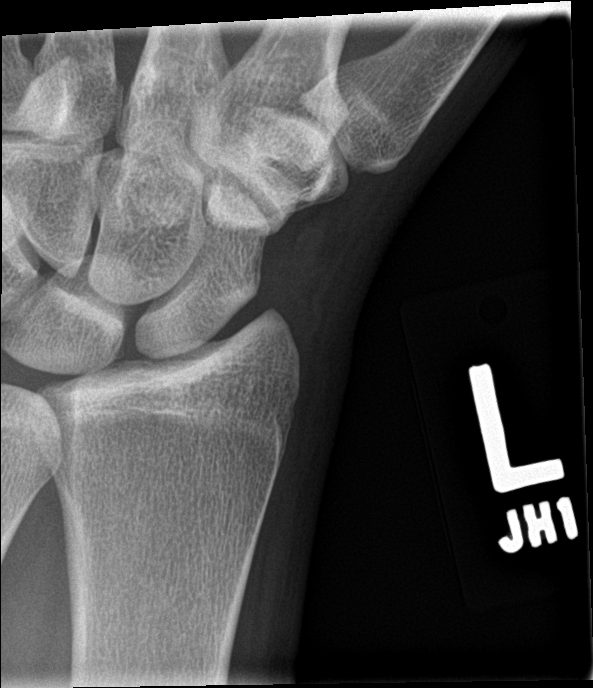

[wrist ap (2 of 2)]
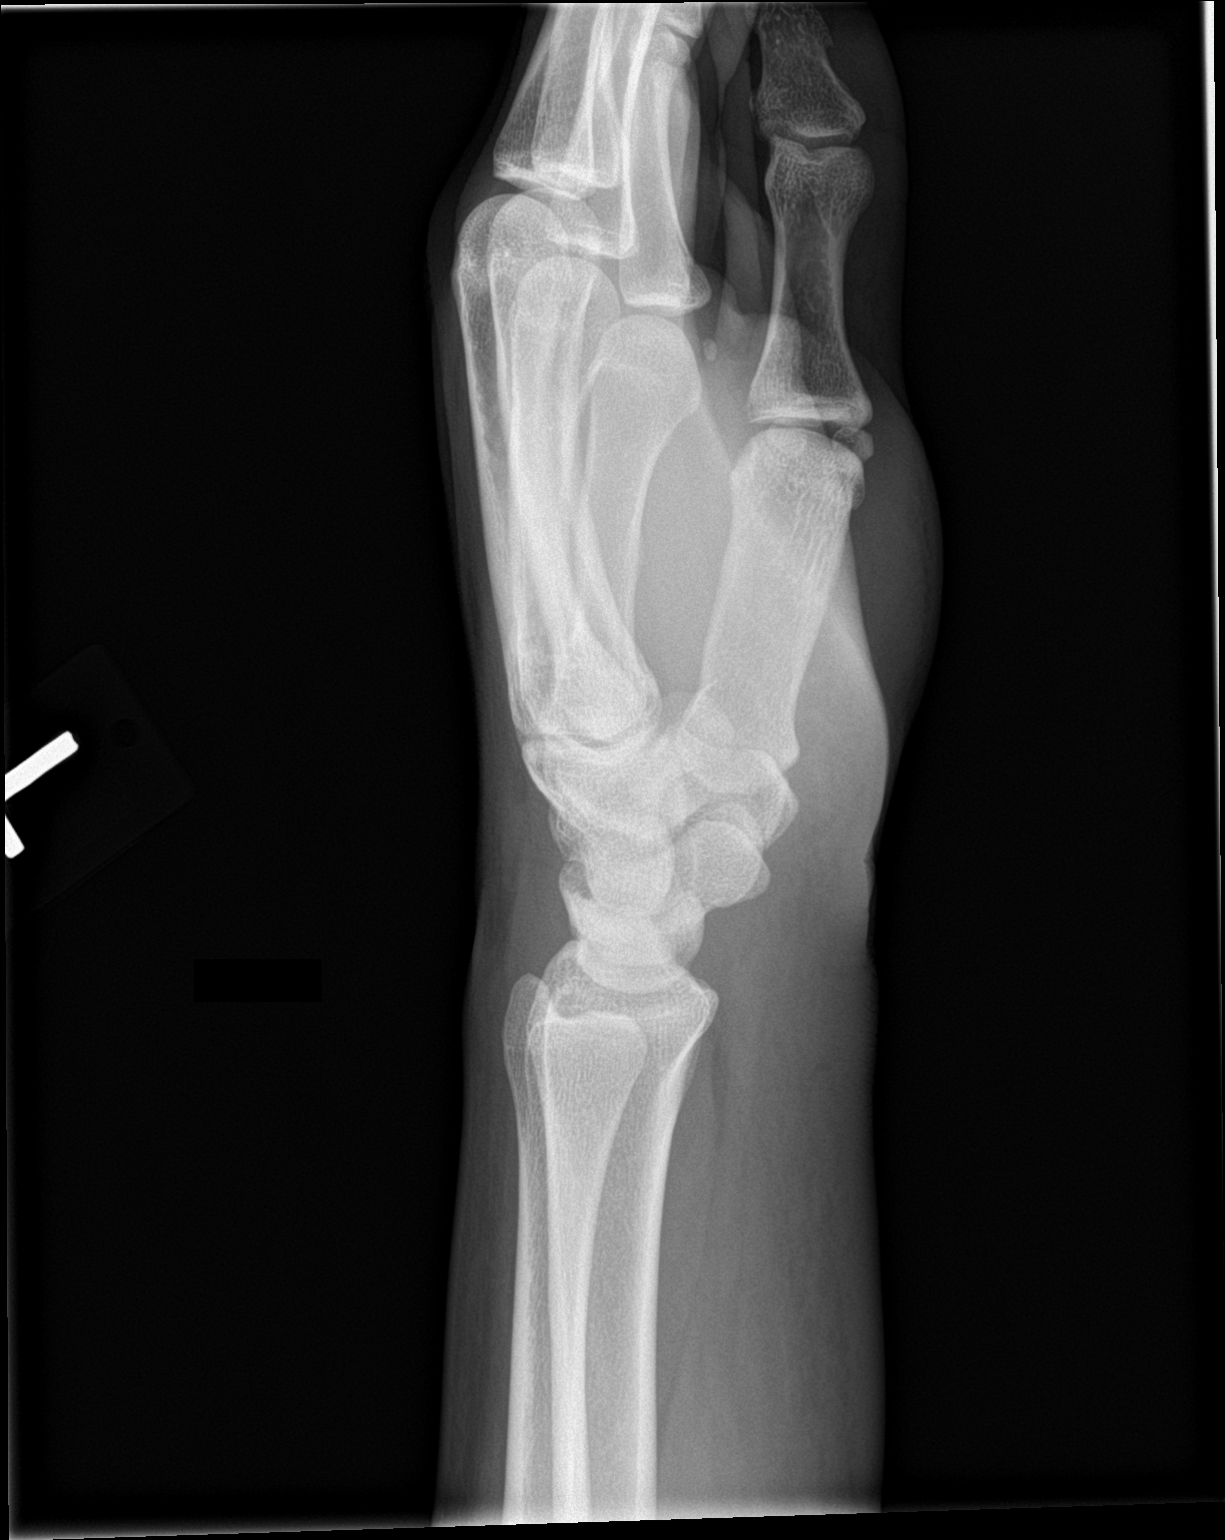

[4 of 4 positions shown; findings below may reference images not displayed]

FINDINGS: There is no evidence of fracture or dislocation. There is no
evidence of arthropathy or other focal bone abnormality. Ulnar-sided
soft tissue swelling of the included forearm, wrist and hand.
IMPRESSION: Soft tissue swelling along the ulnar aspect of the distal forearm,
wrist and hand without radiopaque foreign body, fracture or joint
dislocation.

## 2019-09-05 ENCOUNTER — Encounter (HOSPITAL_COMMUNITY): Payer: Self-pay

## 2019-09-05 ENCOUNTER — Emergency Department (HOSPITAL_COMMUNITY)
Admission: EM | Admit: 2019-09-05 | Discharge: 2019-09-05 | Disposition: A | Discharge status: Home / Self Care | Payer: Self-pay | Attending: Emergency Medicine | Admitting: Emergency Medicine

## 2019-09-05 ENCOUNTER — Other Ambulatory Visit: Payer: Self-pay

## 2019-09-05 DIAGNOSIS — F17210 Nicotine dependence, cigarettes, uncomplicated: Secondary | ICD-10-CM | POA: Insufficient documentation

## 2019-09-05 DIAGNOSIS — R1084 Generalized abdominal pain: Secondary | ICD-10-CM

## 2019-09-05 DIAGNOSIS — Z96659 Presence of unspecified artificial knee joint: Secondary | ICD-10-CM | POA: Insufficient documentation

## 2019-09-05 DIAGNOSIS — Z96669 Presence of unspecified artificial ankle joint: Secondary | ICD-10-CM | POA: Insufficient documentation

## 2019-09-05 DIAGNOSIS — R5383 Other fatigue: Secondary | ICD-10-CM | POA: Insufficient documentation

## 2019-09-05 DIAGNOSIS — Z79899 Other long term (current) drug therapy: Secondary | ICD-10-CM | POA: Insufficient documentation

## 2019-09-05 LAB — COMPREHENSIVE METABOLIC PANEL
ALT: 27 U/L (ref 0–44)
AST: 25 U/L (ref 15–41)
Albumin: 3.9 g/dL (ref 3.5–5.0)
Alkaline Phosphatase: 91 U/L (ref 38–126)
Anion gap: 8 (ref 5–15)
BUN: 13 mg/dL (ref 6–20)
CO2: 29 mmol/L (ref 22–32)
Calcium: 9.3 mg/dL (ref 8.9–10.3)
Chloride: 105 mmol/L (ref 98–111)
Creatinine, Ser: 0.95 mg/dL (ref 0.61–1.24)
GFR calc Af Amer: >60 mL/min (ref >60)
GFR calc non Af Amer: >60 mL/min (ref >60)
Glucose, Bld: 63 mg/dL — ABNORMAL LOW (ref 70–99)
Potassium: 4.6 mmol/L (ref 3.5–5.1)
Sodium: 142 mmol/L (ref 135–145)
Total Bilirubin: 0.6 mg/dL (ref 0.3–1.2)
Total Protein: 7.4 g/dL (ref 6.5–8.1)

## 2019-09-05 LAB — CBC WITH DIFFERENTIAL/PLATELET
Abs Immature Granulocytes: 0.01 10*3/uL (ref 0.00–0.07)
Basophils Absolute: 0 10*3/uL (ref 0.0–0.1)
Basophils Relative: 1 %
Eosinophils Absolute: 0.3 10*3/uL (ref 0.0–0.5)
Eosinophils Relative: 5 %
HCT: 41.7 % (ref 39.0–52.0)
Hemoglobin: 13.5 g/dL (ref 13.0–17.0)
Immature Granulocytes: 0 %
Lymphocytes Relative: 49 %
Lymphs Abs: 2.8 10*3/uL (ref 0.7–4.0)
MCH: 27.7 pg (ref 26.0–34.0)
MCHC: 32.4 g/dL (ref 30.0–36.0)
MCV: 85.6 fL (ref 80.0–100.0)
Monocytes Absolute: 0.5 10*3/uL (ref 0.1–1.0)
Monocytes Relative: 8 %
Neutro Abs: 2.2 10*3/uL (ref 1.7–7.7)
Neutrophils Relative %: 37 %
Platelets: 216 10*3/uL (ref 150–400)
RBC: 4.87 MIL/uL (ref 4.22–5.81)
RDW: 16.7 % — ABNORMAL HIGH (ref 11.5–15.5)
WBC: 5.8 10*3/uL (ref 4.0–10.5)
nRBC: 0 % (ref 0.0–0.2)

## 2019-09-05 LAB — LIPASE, BLOOD: Lipase: 31 U/L (ref 11–51)

## 2019-09-05 LAB — CBG MONITORING, ED: Glucose-Capillary: 122 mg/dL — ABNORMAL HIGH (ref 70–99)

## 2019-09-05 NOTE — ED Notes (Signed)
Pt provided sandwich and coke

## 2019-09-05 NOTE — ED Notes (Signed)
Pt very lethargic.  Responds and then falls right back to sleep.

## 2019-09-05 NOTE — ED Triage Notes (Signed)
Pt c/o fatigue abdominal pain and diaphoresis since last night.

## 2019-09-05 NOTE — Discharge Instructions (Addendum)
Kerry Wiley,  You were seen in the emergency room for abdominal pain. Lab work did not show any abnormalities. You were able to walk around and tolerate a diet before discharge. Please follow-up with your primary provider if your symptoms return or worsen.

## 2019-09-05 NOTE — ED Provider Notes (Signed)
[facility] DEPT Provider Note   CSN: [id number] Arrival date & time: 09/05/19  1346     History Chief Complaint  Patient presents with  . Fatigue  . Abdominal Pain  . Diaphoresis    Kerry Wiley is a 44 y.o. male who presents today for evaluation of abdominal pain. Upon entering the room, pt is lethargic and sleeping over the bedside rail. Responds initially to voice before falling back asleep.   Reassessed at 3:45PM - He is minimally more responsive than prior exam. Able to briefly acknowledge that he began having abdominal pain yesterday. Unable to elaborate further before sleeping again.  The history is limited by the condition of the patient.      Past Medical History:  Diagnosis Date  . 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 Gallstone   . GERD (gastroesophageal reflux disease)    states he has some but not "diagnosed"  . History of kidney stones     Patient Active Problem List   Diagnosis Date Noted  . Viral URI with cough 08/16/2015    Class: Acute  . Spinal stenosis, lumbar region, with neurogenic claudication 08/13/2015    Class: Chronic  . Lumbar disc herniation 08/13/2015    Class: Chronic  . Neurogenic claudication due to lumbar spinal stenosis 08/13/2015  . Suicidal ideation   . Cocaine abuse ([place])   . Fracture metacarpal-open 04/04/2012  . Metacarpal bone fracture 02/17/2012    Past Surgical History:  Procedure Laterality Date  . APPENDECTOMY    . CHOLECYSTECTOMY     2011  . FRACTURE SURGERY Right 95   leg plates, screws  . HAND SURGERY Right 99   metal  . HARDWARE REMOVAL  04/04/2012   Procedure: HARDWARE REMOVAL;  Surgeon: Schuyler Amor, MD;  Location: [facility];  Service: Orthopedics;  Laterality: Right;  right hand hardware removal/tenolysis  . JOINT REPLACEMENT     knee and ankle replacement-1997  . LUMBAR LAMINECTOMY/DECOMPRESSION MICRODISCECTOMY N/A 08/13/2015   Procedure:  BILATERAL LATERAL RECESSED DECOMPRESSION L4 - L5;  Surgeon: Jessy Oto, MD;  Location: [facility];  Service: Orthopedics;  Laterality: N/A;      History reviewed. No pertinent family history.  Social History   Tobacco Use  . Smoking status: Current Every Day Smoker    Packs/day: 0.50    Types: Cigarettes  . Smokeless tobacco: Never Used  Substance Use Topics  . Alcohol use: Yes    Alcohol/week: 2.0 standard drinks    Types: 2 Shots of liquor per week    Comment: social drinkier  . Drug use: Yes    Types: Cocaine    Comment: weekend     Home Medications Prior to Admission medications   Medication Sig Start Date End Date Taking? Authorizing Provider  dextromethorphan-guaiFENesin (MUCINEX DM) 30-600 MG 12hr tablet Take 1 tablet by mouth 2 (two) times daily. Patient not taking: Reported on 11/26/2017 08/16/15   Jessy Oto, MD  diphenhydrAMINE (BENADRYL) 25 MG tablet Take 1 tablet (25 mg total) by mouth every 6 (six) hours as needed for itching. Patient not taking: Reported on 11/26/2017 08/16/15   Jessy Oto, MD  docusate sodium (COLACE) 100 MG capsule Take 1 capsule (100 mg total) by mouth 2 (two) times daily. Patient not taking: Reported on 11/26/2017 08/15/15   Jessy Oto, MD  HYDROcodone-acetaminophen (NORCO/VICODIN) 5-325 MG tablet Take 1-2 tablets by mouth every 6 (six) hours  as needed for severe pain. Patient not taking: Reported on 04/30/2018 11/26/17   Duffy Bruce, MD  methocarbamol (ROBAXIN) 500 MG tablet Take 1 tablet (500 mg total) by mouth 2 (two) times daily as needed for muscle spasms. Patient not taking: Reported on 11/26/2017 05/04/16   Waynetta Pean, PA-C  methylPREDNISolone (MEDROL DOSEPAK) 4 MG TBPK tablet Take as directed on package Patient not taking: Reported on 04/30/2018 11/26/17   Duffy Bruce, MD  morphine (MS CONTIN) 30 MG 12 hr tablet Take 1 tablet (30 mg total) by mouth every 12 (twelve) hours. Patient not taking: Reported on 11/26/2017 08/15/15    Jessy Oto, MD  nortriptyline (PAMELOR) 25 MG capsule Take 1 capsule (25 mg total) by mouth at bedtime. Patient not taking: Reported on 11/26/2017 08/28/16   Jessy Oto, MD  omeprazole (PRILOSEC) 20 MG capsule Take 20 mg by mouth daily.    [provider]  oxaprozin (DAYPRO) 600 MG tablet Take one tablet twice a day for 2 weeks then, 0ne tablet  q day with meal or snack prn. Patient not taking: Reported on 11/26/2017 08/28/16   Jessy Oto, MD  oxyCODONE-acetaminophen (PERCOCET) 10-325 MG tablet Take 1 tablet by mouth every 6 (six) hours as needed for pain. Patient not taking: Reported on 11/26/2017 08/06/16   Jessy Oto, MD  tiZANidine (ZANAFLEX) 4 MG tablet Take 1 tablet (4 mg total) by mouth every 8 (eight) hours as needed for muscle spasms. Patient not taking: Reported on 11/26/2017 12/02/16   Mcarthur Rossetti, MD  traMADol Veatrice Bourbon) 50 MG tablet 1-2 every 2 hours as needed for pain Patient not taking: Reported on 11/26/2017 01/29/17   Mcarthur Rossetti, MD    Allergies    Prednisone  Review of Systems   Review of Systems  Unable to be performed due to pt's condition.  Physical Exam Updated Vital Signs BP (!) 170/102   Pulse 92   Temp 98.3 F (36.8 C) (Oral)   Resp (!) 23   SpO2 97%   Physical Exam Vitals and nursing note reviewed.  Constitutional:      Appearance: Normal appearance.     Comments: Pt sleeping comfortably in bed, in no acute distress.  HENT:     Head: Normocephalic and atraumatic.     Mouth/Throat:     Mouth: Mucous membranes are moist.  Eyes:     Conjunctiva/sclera: Conjunctivae normal.     Pupils: Pupils are equal, round, and reactive to light.  Cardiovascular:     Rate and Rhythm: Normal rate and regular rhythm.     Heart sounds: Normal heart sounds.  Pulmonary:     Effort: Pulmonary effort is normal.     Breath sounds: Normal breath sounds.     Comments: Transmitted upper airway sounds/snoring. Abdominal:     General:  Abdomen is flat. Bowel sounds are normal. There is no distension.     Palpations: Abdomen is soft.     Tenderness: There is no abdominal tenderness. There is no guarding or rebound.  Musculoskeletal:        General: No tenderness or deformity. Normal range of motion.     Right lower leg: No edema.     Left lower leg: No edema.  Skin:    General: Skin is warm and dry.  Neurological:     Mental Status: He is lethargic.     Comments: Will arouse briefly to voice, unable to follow commands. Moving all extremities spontaneously.  ED Results / Procedures / Treatments   Labs (all labs ordered are listed, but only abnormal results are displayed) Labs Reviewed  CBC WITH DIFFERENTIAL/PLATELET - Abnormal; Notable for the following components:      Result Value   RDW 16.7 (*)    All other components within normal limits  COMPREHENSIVE METABOLIC PANEL  LIPASE, BLOOD    EKG None  Radiology No results found.  Procedures Procedures (including critical care time)  Medications Ordered in ED Medications - No data to display  ED Course  I have reviewed the triage vital signs and the nursing notes.  Pertinent labs & imaging results that were available during my care of the patient were reviewed by me and considered in my medical decision making (see chart for details).    MDM Rules/Calculators/A&P                       Perrin Guinta is a 44 y.o. male who presents today for evaluation of abdominal pain. Pt is lethargic and sleeping over the bedside rail. Responds briefly to voice. Pt afebrile and VSS. No apparent medical etiology for encephalopathy. Lab work without metabolic derangement, acute anemia, or renal dysfunction. Initial glucose on CMP mildly decreased to 63. On CBG recheck, glucose elevated to 122. CMP with normal LFTs, t bili, alk phos, and lipase.  4:45PM - upon reassessing, pt is more awake resting comfortably in bed. States he is now only having abdominal pain when  sitting up. Informed his of his normal lab tests. Will attempt ambulation and PO challenge. Suspect pt will be able to discharge home from the ED.   Final Clinical Impression(s) / ED Diagnoses Final diagnoses:  None    Rx / DC Orders ED Discharge Orders    None       Ladona Horns, MD 09/05/19 [place], [place], DO 09/05/19 1725

## 2021-10-11 DEATH — deceased
